# Patient Record
Sex: Male | Born: 1945 | Race: White | Hispanic: No | Marital: Married | Smoking: Never smoker
Health system: Southern US, Community
[De-identification: ages and names within clinical notes are randomized; demographics above are authoritative.]

## PROBLEM LIST (undated history)

## (undated) DIAGNOSIS — I10 Essential (primary) hypertension: Secondary | ICD-10-CM

## (undated) DIAGNOSIS — E7800 Pure hypercholesterolemia, unspecified: Secondary | ICD-10-CM

## (undated) HISTORY — PX: COLONOSCOPY: SHX174

---

## 2003-02-18 ENCOUNTER — Ambulatory Visit (HOSPITAL_COMMUNITY): Admission: RE | Admit: 2003-02-18 | Discharge: 2003-02-18 | Payer: Self-pay | Admitting: Family Medicine

## 2003-02-25 ENCOUNTER — Ambulatory Visit (HOSPITAL_COMMUNITY): Admission: RE | Admit: 2003-02-25 | Discharge: 2003-02-25 | Payer: Self-pay | Admitting: Family Medicine

## 2003-05-12 ENCOUNTER — Ambulatory Visit (HOSPITAL_COMMUNITY): Admission: RE | Admit: 2003-05-12 | Discharge: 2003-05-12 | Payer: Self-pay | Admitting: Internal Medicine

## 2004-09-14 ENCOUNTER — Ambulatory Visit (HOSPITAL_COMMUNITY): Admission: RE | Admit: 2004-09-14 | Discharge: 2004-09-14 | Payer: Self-pay | Admitting: Family Medicine

## 2008-08-25 ENCOUNTER — Emergency Department (HOSPITAL_COMMUNITY): Admission: EM | Admit: 2008-08-25 | Discharge: 2008-08-25 | Payer: Self-pay | Admitting: Emergency Medicine

## 2010-03-15 ENCOUNTER — Encounter (INDEPENDENT_AMBULATORY_CARE_PROVIDER_SITE_OTHER): Payer: Self-pay

## 2010-03-16 ENCOUNTER — Encounter: Payer: Self-pay | Admitting: Internal Medicine

## 2010-04-01 NOTE — Letter (Signed)
Summary: TRIAGE  TRIAGE   Imported By: Rexene Alberts 03/16/2010 08:18:33  _____________________________________________________________________  External Attachment:    Type:   Image     Comment:   External Document  Appended Document: TRIAGE ok as is  Appended Document: TRIAGE Rx and instructions mailed to pt. Order was faxed to [facility].

## 2010-04-01 NOTE — Miscellaneous (Signed)
Summary: Colonoscopy 05/12/2003  Clinical Lists Changes      NAME:  Ronald Dixon, Ronald Dixon                        ACCOUNT NO.:  [id number]   MEDICAL RECORD NO.:  [id number]                   PATIENT TYPE:  AMB   LOCATION:  DAY                                  FACILITY:  [facility]   PHYSICIAN:  R. Roetta Sessions, M.D.              DATE OF BIRTH:  September 04, 1945   DATE OF PROCEDURE:  05/12/2003  DATE OF DISCHARGE:                                 OPERATIVE REPORT   PROCEDURE:  High-risk screening colonoscopy.   INDICATIONS FOR PROCEDURE:  The patient is a 65 year old gentleman with a  history of colon cancer in his brother who was diagnosed at age 13.  This  gentleman had a colonoscopy in 1999 which was normal.  He is devoid of any  lower GI tract symptoms.  High-risk screening colonoscopy is now being  performed.  This approach has been discussed with the patient previously and  again today at the bedside.  The potential risks, benefits, and alternatives  have been reviewed.  Please see my handwritten H&P for more information.   PROCEDURE:  O2 saturation, blood pressure, pulses, and respirations were  monitored throughout the entirety of the procedure.  Conscious sedation was  with Versed 4 mg IV, Demerol 75 mg IV, Atropine 0.25 mg IV for asymptomatic  bradycardia (he was bradycardic during the procedure last time, and Atropine  was given prior to the procedure this time).  The instrument used was the  Olympus video chip adult colonoscope.   FINDINGS:  Digital rectal examination revealed no abnormalities.   ENDOSCOPIC FINDINGS:  The prep was excellent.   Rectum:  Examination of the rectal mucosa including retroflex view of the  anal verge revealed no abnormalities.   Colon:  The colonic mucosa was surveyed from the rectosigmoid junction  through the left, transverse, right colon to the area of the appendiceal  orifice, ileocecal valve/cecum.  These structures were well-seen and  photographed for the record.  From this level, the scope was slowly  withdrawn.  All previously mentioned mucosal surfaces were again seen.  The  colonic mucosa and the rectal mucosa appeared entirely normal.  The patient  tolerated the procedure well and was reactive in endoscopy.   IMPRESSION:  1. Normal rectum.  2. Normal colon.   RECOMMENDATIONS:  Repeat colonoscopy in five years.      ___________________________________________                                            Jonathon Bellows, M.D.   RMR/MEDQ  D:  05/12/2003  T:  05/12/2003  Job:  [id number]   cc:   Corrie Mckusick, M.D.  [address] [place]. [place]  [place]  [place] [address]  Fax: [phone]

## 2010-04-05 ENCOUNTER — Encounter: Payer: BC Managed Care – PPO | Admitting: Internal Medicine

## 2010-04-05 ENCOUNTER — Ambulatory Visit (HOSPITAL_COMMUNITY)
Admission: RE | Admit: 2010-04-05 | Discharge: 2010-04-05 | Disposition: A | Discharge status: Home / Self Care | Payer: BC Managed Care – PPO | Source: Ambulatory Visit | Attending: Internal Medicine | Admitting: Internal Medicine

## 2010-04-05 DIAGNOSIS — Z79899 Other long term (current) drug therapy: Secondary | ICD-10-CM | POA: Insufficient documentation

## 2010-04-05 DIAGNOSIS — Z1211 Encounter for screening for malignant neoplasm of colon: Secondary | ICD-10-CM | POA: Insufficient documentation

## 2010-04-05 DIAGNOSIS — I10 Essential (primary) hypertension: Secondary | ICD-10-CM | POA: Insufficient documentation

## 2010-04-05 DIAGNOSIS — E785 Hyperlipidemia, unspecified: Secondary | ICD-10-CM | POA: Insufficient documentation

## 2010-04-05 DIAGNOSIS — Z800 Family history of malignant neoplasm of digestive organs: Secondary | ICD-10-CM

## 2010-04-20 NOTE — Op Note (Signed)
  NAME:  Ronald Dixon, Ronald Dixon              ACCOUNT NO.:  [id number]  MEDICAL RECORD NO.:  [id number]           PATIENT TYPE:  O  LOCATION:  DAYP                          FACILITY:  [facility]  PHYSICIAN:  R. Roetta Sessions, M.D. DATE OF BIRTH:  [birth year]  DATE OF PROCEDURE:  04/05/2010 DATE OF DISCHARGE:                              OPERATIVE REPORT   PROCEDURE:  High risk screening colonoscopy.  INDICATIONS FOR PROCEDURE:  A 65 year old gentleman with positive family history of colon cancer first-degree relative at young age.  He is here for followup screening colonoscopy, had a normal exam back in 2005.  He has no lower GI tract symptoms.  Colonoscopy is now being done as high risk screening maneuver.  Risks, benefits, limitations, alternatives, imponderables have been reviewed, questions answered.  Please see the documentation in the medical record.  PROCEDURE NOTE:  O2 saturation, blood pressure, pulse, respirations were monitored throughout the entire procedure.  CONSCIOUS SEDATION:  Versed 5 mg IV, Demerol 125 mg IV in divided doses.  INSTRUMENT:  Pentax video chip system.  FINDINGS:  Digital rectal exam revealed no abnormalities.  Endoscopic findings:  Prep was good.  Examination of colonic mucosa was undertaken from the rectosigmoid junction through the left transverse right colon to the appendiceal orifice, ileocecal valve/cecum.  These structures were well seen and photographed for the record.  Terminal ileum was intubated to 10 cm.  From this level, scope was slowly withdrawn.  All previously mentioned mucosal surfaces were again seen.  The colonic mucosa appeared entirely normal.  The scope was pulled down into the rectum where a thorough examination of rectal mucosa including retroflexed view of the anal verge demonstrated no abnormalities.  The patient tolerated the procedure well.  Cecal withdrawal time 10 minutes.  IMPRESSION:  Normal rectum, colon, and terminal  ileum.  RECOMMENDATIONS:  Repeat screening colonoscopy in 5 years.     Jonathon Bellows, M.D.     RMR/MEDQ  D:  04/05/2010  T:  04/05/2010  Job:  [id number]  cc:   Corrie Mckusick, M.D. Fax: [phone]  Electronically Signed by Lorrin Goodell M.D. on 04/20/2010 02:32:41 PM

## 2010-06-07 LAB — DIFFERENTIAL
Basophils Absolute: 0 10*3/uL (ref 0.0–0.1)
Basophils Relative: 0 % (ref 0–1)
Eosinophils Absolute: 0 10*3/uL (ref 0.0–0.7)
Eosinophils Relative: 1 % (ref 0–5)
Lymphocytes Relative: 38 % (ref 12–46)
Lymphs Abs: 2.4 10*3/uL (ref 0.7–4.0)
Monocytes Absolute: 0.5 10*3/uL (ref 0.1–1.0)
Monocytes Relative: 8 % (ref 3–12)
Neutro Abs: 3.3 10*3/uL (ref 1.7–7.7)
Neutrophils Relative %: 53 % (ref 43–77)

## 2010-06-07 LAB — CBC
HCT: 43 % (ref 39.0–52.0)
Hemoglobin: 15.4 g/dL (ref 13.0–17.0)
MCHC: 35.9 g/dL (ref 30.0–36.0)
MCV: 90.6 fL (ref 78.0–100.0)
Platelets: 228 10*3/uL (ref 150–400)
RBC: 4.75 MIL/uL (ref 4.22–5.81)
RDW: 12.6 % (ref 11.5–15.5)
WBC: 6.3 10*3/uL (ref 4.0–10.5)

## 2010-06-07 LAB — BASIC METABOLIC PANEL
BUN: 22 mg/dL (ref 6–23)
CO2: 24 mEq/L (ref 19–32)
Calcium: 8.9 mg/dL (ref 8.4–10.5)
Chloride: 104 mEq/L (ref 96–112)
Creatinine, Ser: 0.97 mg/dL (ref 0.4–1.5)
GFR calc Af Amer: >60 mL/min (ref >60)
GFR calc non Af Amer: >60 mL/min (ref >60)
Glucose, Bld: 140 mg/dL — ABNORMAL HIGH (ref 70–99)
Potassium: 3.7 mEq/L (ref 3.5–5.1)
Sodium: 136 mEq/L (ref 135–145)

## 2010-07-16 NOTE — Op Note (Signed)
NAME:  Resch, Olson C                        ACCOUNT NO.:  [id number]   MEDICAL RECORD NO.:  [id number]                   PATIENT TYPE:  AMB   LOCATION:  DAY                                  FACILITY:  [facility]   PHYSICIAN:  R. Roetta Sessions, M.D.              DATE OF BIRTH:  Apr 03, 1945   DATE OF PROCEDURE:  05/12/2003  DATE OF DISCHARGE:                                 OPERATIVE REPORT   PROCEDURE:  High-risk screening colonoscopy.   INDICATIONS FOR PROCEDURE:  The patient is a 65 year old gentleman with a  history of colon cancer in his brother who was diagnosed at age 72.  This  gentleman had a colonoscopy in 1999 which was normal.  He is devoid of any  lower GI tract symptoms.  High-risk screening colonoscopy is now being  performed.  This approach has been discussed with the patient previously and  again today at the bedside.  The potential risks, benefits, and alternatives  have been reviewed.  Please see my handwritten H&P for more information.   PROCEDURE:  O2 saturation, blood pressure, pulses, and respirations were  monitored throughout the entirety of the procedure.  Conscious sedation was  with Versed 4 mg IV, Demerol 75 mg IV, Atropine 0.25 mg IV for asymptomatic  bradycardia (he was bradycardic during the procedure last time, and Atropine  was given prior to the procedure this time).  The instrument used was the  Olympus video chip adult colonoscope.   FINDINGS:  Digital rectal examination revealed no abnormalities.   ENDOSCOPIC FINDINGS:  The prep was excellent.   Rectum:  Examination of the rectal mucosa including retroflex view of the  anal verge revealed no abnormalities.   Colon:  The colonic mucosa was surveyed from the rectosigmoid junction  through the left, transverse, right colon to the area of the appendiceal  orifice, ileocecal valve/cecum.  These structures were well-seen and  photographed for the record.  From this level, the scope was slowly  withdrawn.  All previously mentioned mucosal surfaces were again seen.  The  colonic mucosa and the rectal mucosa appeared entirely normal.  The patient  tolerated the procedure well and was reactive in endoscopy.   IMPRESSION:  1. Normal rectum.  2. Normal colon.   RECOMMENDATIONS:  Repeat colonoscopy in five years.      ___________________________________________                                            Jonathon Bellows, M.D.   RMR/MEDQ  D:  05/12/2003  T:  05/12/2003  Job:  [id number]   cc:   Corrie Mckusick, M.D.  [address] [place]. [place]  [place]  [place] [address]  Fax: [phone]

## 2012-05-09 DIAGNOSIS — Z85828 Personal history of other malignant neoplasm of skin: Secondary | ICD-10-CM | POA: Diagnosis not present

## 2012-05-09 DIAGNOSIS — L821 Other seborrheic keratosis: Secondary | ICD-10-CM | POA: Diagnosis not present

## 2012-05-09 DIAGNOSIS — L570 Actinic keratosis: Secondary | ICD-10-CM | POA: Diagnosis not present

## 2012-05-09 DIAGNOSIS — D2320 Other benign neoplasm of skin of unspecified ear and external auricular canal: Secondary | ICD-10-CM | POA: Diagnosis not present

## 2012-09-03 DIAGNOSIS — D0430 Carcinoma in situ of skin of unspecified part of face: Secondary | ICD-10-CM | POA: Diagnosis not present

## 2012-09-03 DIAGNOSIS — Z85828 Personal history of other malignant neoplasm of skin: Secondary | ICD-10-CM | POA: Diagnosis not present

## 2012-09-03 DIAGNOSIS — D0439 Carcinoma in situ of skin of other parts of face: Secondary | ICD-10-CM | POA: Diagnosis not present

## 2012-09-25 DIAGNOSIS — Z85828 Personal history of other malignant neoplasm of skin: Secondary | ICD-10-CM | POA: Diagnosis not present

## 2012-09-25 DIAGNOSIS — C44320 Squamous cell carcinoma of skin of unspecified parts of face: Secondary | ICD-10-CM | POA: Diagnosis not present

## 2012-11-19 DIAGNOSIS — L819 Disorder of pigmentation, unspecified: Secondary | ICD-10-CM | POA: Diagnosis not present

## 2012-11-19 DIAGNOSIS — Z85828 Personal history of other malignant neoplasm of skin: Secondary | ICD-10-CM | POA: Diagnosis not present

## 2012-11-19 DIAGNOSIS — L570 Actinic keratosis: Secondary | ICD-10-CM | POA: Diagnosis not present

## 2013-01-30 DIAGNOSIS — J069 Acute upper respiratory infection, unspecified: Secondary | ICD-10-CM | POA: Diagnosis not present

## 2013-01-30 DIAGNOSIS — H6980 Other specified disorders of Eustachian tube, unspecified ear: Secondary | ICD-10-CM | POA: Diagnosis not present

## 2013-01-30 DIAGNOSIS — Z6830 Body mass index (BMI) 30.0-30.9, adult: Secondary | ICD-10-CM | POA: Diagnosis not present

## 2013-03-21 DIAGNOSIS — Z0000 Encounter for general adult medical examination without abnormal findings: Secondary | ICD-10-CM | POA: Diagnosis not present

## 2013-03-21 DIAGNOSIS — E785 Hyperlipidemia, unspecified: Secondary | ICD-10-CM | POA: Diagnosis not present

## 2013-03-21 DIAGNOSIS — Z6830 Body mass index (BMI) 30.0-30.9, adult: Secondary | ICD-10-CM | POA: Diagnosis not present

## 2013-03-21 DIAGNOSIS — I10 Essential (primary) hypertension: Secondary | ICD-10-CM | POA: Diagnosis not present

## 2013-03-21 DIAGNOSIS — R972 Elevated prostate specific antigen [PSA]: Secondary | ICD-10-CM | POA: Diagnosis not present

## 2013-03-25 DIAGNOSIS — Z85828 Personal history of other malignant neoplasm of skin: Secondary | ICD-10-CM | POA: Diagnosis not present

## 2013-03-25 DIAGNOSIS — L570 Actinic keratosis: Secondary | ICD-10-CM | POA: Diagnosis not present

## 2013-03-25 DIAGNOSIS — L821 Other seborrheic keratosis: Secondary | ICD-10-CM | POA: Diagnosis not present

## 2013-04-11 DIAGNOSIS — Z6831 Body mass index (BMI) 31.0-31.9, adult: Secondary | ICD-10-CM | POA: Diagnosis not present

## 2013-04-11 DIAGNOSIS — R972 Elevated prostate specific antigen [PSA]: Secondary | ICD-10-CM | POA: Diagnosis not present

## 2013-07-18 DIAGNOSIS — H524 Presbyopia: Secondary | ICD-10-CM | POA: Diagnosis not present

## 2013-07-18 DIAGNOSIS — H52229 Regular astigmatism, unspecified eye: Secondary | ICD-10-CM | POA: Diagnosis not present

## 2013-07-18 DIAGNOSIS — H43819 Vitreous degeneration, unspecified eye: Secondary | ICD-10-CM | POA: Diagnosis not present

## 2013-07-18 DIAGNOSIS — H5200 Hypermetropia, unspecified eye: Secondary | ICD-10-CM | POA: Diagnosis not present

## 2013-09-23 DIAGNOSIS — L821 Other seborrheic keratosis: Secondary | ICD-10-CM | POA: Diagnosis not present

## 2013-09-23 DIAGNOSIS — L570 Actinic keratosis: Secondary | ICD-10-CM | POA: Diagnosis not present

## 2013-09-23 DIAGNOSIS — Z85828 Personal history of other malignant neoplasm of skin: Secondary | ICD-10-CM | POA: Diagnosis not present

## 2014-03-19 DIAGNOSIS — L570 Actinic keratosis: Secondary | ICD-10-CM | POA: Diagnosis not present

## 2014-03-19 DIAGNOSIS — D485 Neoplasm of uncertain behavior of skin: Secondary | ICD-10-CM | POA: Diagnosis not present

## 2014-03-19 DIAGNOSIS — Z85828 Personal history of other malignant neoplasm of skin: Secondary | ICD-10-CM | POA: Diagnosis not present

## 2014-03-19 DIAGNOSIS — L821 Other seborrheic keratosis: Secondary | ICD-10-CM | POA: Diagnosis not present

## 2014-04-01 DIAGNOSIS — Z23 Encounter for immunization: Secondary | ICD-10-CM | POA: Diagnosis not present

## 2014-04-01 DIAGNOSIS — E6609 Other obesity due to excess calories: Secondary | ICD-10-CM | POA: Diagnosis not present

## 2014-04-01 DIAGNOSIS — R7301 Impaired fasting glucose: Secondary | ICD-10-CM | POA: Diagnosis not present

## 2014-04-01 DIAGNOSIS — Z6830 Body mass index (BMI) 30.0-30.9, adult: Secondary | ICD-10-CM | POA: Diagnosis not present

## 2014-04-01 DIAGNOSIS — Z0000 Encounter for general adult medical examination without abnormal findings: Secondary | ICD-10-CM | POA: Diagnosis not present

## 2014-04-01 DIAGNOSIS — I10 Essential (primary) hypertension: Secondary | ICD-10-CM | POA: Diagnosis not present

## 2014-04-01 DIAGNOSIS — E782 Mixed hyperlipidemia: Secondary | ICD-10-CM | POA: Diagnosis not present

## 2014-04-15 DIAGNOSIS — Z125 Encounter for screening for malignant neoplasm of prostate: Secondary | ICD-10-CM | POA: Diagnosis not present

## 2014-04-15 DIAGNOSIS — R7301 Impaired fasting glucose: Secondary | ICD-10-CM | POA: Diagnosis not present

## 2014-04-15 DIAGNOSIS — Z23 Encounter for immunization: Secondary | ICD-10-CM | POA: Diagnosis not present

## 2014-04-15 DIAGNOSIS — I10 Essential (primary) hypertension: Secondary | ICD-10-CM | POA: Diagnosis not present

## 2014-04-15 DIAGNOSIS — E782 Mixed hyperlipidemia: Secondary | ICD-10-CM | POA: Diagnosis not present

## 2014-06-16 DIAGNOSIS — L814 Other melanin hyperpigmentation: Secondary | ICD-10-CM | POA: Diagnosis not present

## 2014-06-16 DIAGNOSIS — Z85828 Personal history of other malignant neoplasm of skin: Secondary | ICD-10-CM | POA: Diagnosis not present

## 2014-06-16 DIAGNOSIS — L570 Actinic keratosis: Secondary | ICD-10-CM | POA: Diagnosis not present

## 2014-11-17 DIAGNOSIS — L820 Inflamed seborrheic keratosis: Secondary | ICD-10-CM | POA: Diagnosis not present

## 2014-11-17 DIAGNOSIS — L570 Actinic keratosis: Secondary | ICD-10-CM | POA: Diagnosis not present

## 2014-11-17 DIAGNOSIS — Z85828 Personal history of other malignant neoplasm of skin: Secondary | ICD-10-CM | POA: Diagnosis not present

## 2014-11-17 DIAGNOSIS — D225 Melanocytic nevi of trunk: Secondary | ICD-10-CM | POA: Diagnosis not present

## 2014-11-17 DIAGNOSIS — L738 Other specified follicular disorders: Secondary | ICD-10-CM | POA: Diagnosis not present

## 2015-03-24 ENCOUNTER — Encounter: Payer: Self-pay | Admitting: Internal Medicine

## 2015-04-24 DIAGNOSIS — Z125 Encounter for screening for malignant neoplasm of prostate: Secondary | ICD-10-CM | POA: Diagnosis not present

## 2015-04-24 DIAGNOSIS — Z1389 Encounter for screening for other disorder: Secondary | ICD-10-CM | POA: Diagnosis not present

## 2015-04-24 DIAGNOSIS — I10 Essential (primary) hypertension: Secondary | ICD-10-CM | POA: Diagnosis not present

## 2015-04-24 DIAGNOSIS — E6609 Other obesity due to excess calories: Secondary | ICD-10-CM | POA: Diagnosis not present

## 2015-04-24 DIAGNOSIS — R7309 Other abnormal glucose: Secondary | ICD-10-CM | POA: Diagnosis not present

## 2015-04-24 DIAGNOSIS — Z6830 Body mass index (BMI) 30.0-30.9, adult: Secondary | ICD-10-CM | POA: Diagnosis not present

## 2015-04-24 DIAGNOSIS — E782 Mixed hyperlipidemia: Secondary | ICD-10-CM | POA: Diagnosis not present

## 2015-04-24 DIAGNOSIS — Z0000 Encounter for general adult medical examination without abnormal findings: Secondary | ICD-10-CM | POA: Diagnosis not present

## 2015-07-06 DIAGNOSIS — L570 Actinic keratosis: Secondary | ICD-10-CM | POA: Diagnosis not present

## 2015-07-06 DIAGNOSIS — L814 Other melanin hyperpigmentation: Secondary | ICD-10-CM | POA: Diagnosis not present

## 2015-07-06 DIAGNOSIS — Z85828 Personal history of other malignant neoplasm of skin: Secondary | ICD-10-CM | POA: Diagnosis not present

## 2015-12-07 DIAGNOSIS — C4441 Basal cell carcinoma of skin of scalp and neck: Secondary | ICD-10-CM | POA: Diagnosis not present

## 2015-12-07 DIAGNOSIS — D1801 Hemangioma of skin and subcutaneous tissue: Secondary | ICD-10-CM | POA: Diagnosis not present

## 2015-12-07 DIAGNOSIS — C44319 Basal cell carcinoma of skin of other parts of face: Secondary | ICD-10-CM | POA: Diagnosis not present

## 2015-12-07 DIAGNOSIS — Z85828 Personal history of other malignant neoplasm of skin: Secondary | ICD-10-CM | POA: Diagnosis not present

## 2015-12-07 DIAGNOSIS — D2271 Melanocytic nevi of right lower limb, including hip: Secondary | ICD-10-CM | POA: Diagnosis not present

## 2015-12-07 DIAGNOSIS — L821 Other seborrheic keratosis: Secondary | ICD-10-CM | POA: Diagnosis not present

## 2015-12-07 DIAGNOSIS — L570 Actinic keratosis: Secondary | ICD-10-CM | POA: Diagnosis not present

## 2015-12-07 DIAGNOSIS — D225 Melanocytic nevi of trunk: Secondary | ICD-10-CM | POA: Diagnosis not present

## 2015-12-21 DIAGNOSIS — C44319 Basal cell carcinoma of skin of other parts of face: Secondary | ICD-10-CM | POA: Diagnosis not present

## 2015-12-21 DIAGNOSIS — Z85828 Personal history of other malignant neoplasm of skin: Secondary | ICD-10-CM | POA: Diagnosis not present

## 2015-12-28 DIAGNOSIS — Z4802 Encounter for removal of sutures: Secondary | ICD-10-CM | POA: Diagnosis not present

## 2016-01-06 DIAGNOSIS — Z23 Encounter for immunization: Secondary | ICD-10-CM | POA: Diagnosis not present

## 2016-02-02 DIAGNOSIS — M25561 Pain in right knee: Secondary | ICD-10-CM | POA: Diagnosis not present

## 2016-04-25 DIAGNOSIS — Z125 Encounter for screening for malignant neoplasm of prostate: Secondary | ICD-10-CM | POA: Diagnosis not present

## 2016-04-25 DIAGNOSIS — Z6831 Body mass index (BMI) 31.0-31.9, adult: Secondary | ICD-10-CM | POA: Diagnosis not present

## 2016-04-25 DIAGNOSIS — Z1389 Encounter for screening for other disorder: Secondary | ICD-10-CM | POA: Diagnosis not present

## 2016-04-25 DIAGNOSIS — I10 Essential (primary) hypertension: Secondary | ICD-10-CM | POA: Diagnosis not present

## 2016-04-25 DIAGNOSIS — E782 Mixed hyperlipidemia: Secondary | ICD-10-CM | POA: Diagnosis not present

## 2016-04-25 DIAGNOSIS — Z0001 Encounter for general adult medical examination with abnormal findings: Secondary | ICD-10-CM | POA: Diagnosis not present

## 2016-04-25 DIAGNOSIS — E6609 Other obesity due to excess calories: Secondary | ICD-10-CM | POA: Diagnosis not present

## 2016-04-25 DIAGNOSIS — R7309 Other abnormal glucose: Secondary | ICD-10-CM | POA: Diagnosis not present

## 2016-06-13 DIAGNOSIS — L570 Actinic keratosis: Secondary | ICD-10-CM | POA: Diagnosis not present

## 2016-06-13 DIAGNOSIS — L821 Other seborrheic keratosis: Secondary | ICD-10-CM | POA: Diagnosis not present

## 2016-06-13 DIAGNOSIS — Z85828 Personal history of other malignant neoplasm of skin: Secondary | ICD-10-CM | POA: Diagnosis not present

## 2016-12-13 DIAGNOSIS — L570 Actinic keratosis: Secondary | ICD-10-CM | POA: Diagnosis not present

## 2016-12-13 DIAGNOSIS — L821 Other seborrheic keratosis: Secondary | ICD-10-CM | POA: Diagnosis not present

## 2016-12-13 DIAGNOSIS — Z85828 Personal history of other malignant neoplasm of skin: Secondary | ICD-10-CM | POA: Diagnosis not present

## 2016-12-13 DIAGNOSIS — D485 Neoplasm of uncertain behavior of skin: Secondary | ICD-10-CM | POA: Diagnosis not present

## 2016-12-13 DIAGNOSIS — D225 Melanocytic nevi of trunk: Secondary | ICD-10-CM | POA: Diagnosis not present

## 2016-12-13 DIAGNOSIS — D1801 Hemangioma of skin and subcutaneous tissue: Secondary | ICD-10-CM | POA: Diagnosis not present

## 2016-12-20 DIAGNOSIS — D485 Neoplasm of uncertain behavior of skin: Secondary | ICD-10-CM | POA: Diagnosis not present

## 2016-12-20 DIAGNOSIS — Z85828 Personal history of other malignant neoplasm of skin: Secondary | ICD-10-CM | POA: Diagnosis not present

## 2016-12-20 DIAGNOSIS — L988 Other specified disorders of the skin and subcutaneous tissue: Secondary | ICD-10-CM | POA: Diagnosis not present

## 2016-12-26 DIAGNOSIS — Z23 Encounter for immunization: Secondary | ICD-10-CM | POA: Diagnosis not present

## 2017-04-27 DIAGNOSIS — E782 Mixed hyperlipidemia: Secondary | ICD-10-CM | POA: Diagnosis not present

## 2017-04-27 DIAGNOSIS — Z6830 Body mass index (BMI) 30.0-30.9, adult: Secondary | ICD-10-CM | POA: Diagnosis not present

## 2017-04-27 DIAGNOSIS — I10 Essential (primary) hypertension: Secondary | ICD-10-CM | POA: Diagnosis not present

## 2017-04-27 DIAGNOSIS — Z0001 Encounter for general adult medical examination with abnormal findings: Secondary | ICD-10-CM | POA: Diagnosis not present

## 2017-04-27 DIAGNOSIS — Z8371 Family history of colonic polyps: Secondary | ICD-10-CM | POA: Diagnosis not present

## 2017-04-27 DIAGNOSIS — R7309 Other abnormal glucose: Secondary | ICD-10-CM | POA: Diagnosis not present

## 2017-04-27 DIAGNOSIS — E559 Vitamin D deficiency, unspecified: Secondary | ICD-10-CM | POA: Diagnosis not present

## 2017-04-27 DIAGNOSIS — Z125 Encounter for screening for malignant neoplasm of prostate: Secondary | ICD-10-CM | POA: Diagnosis not present

## 2017-04-27 DIAGNOSIS — Z1389 Encounter for screening for other disorder: Secondary | ICD-10-CM | POA: Diagnosis not present

## 2017-05-09 ENCOUNTER — Encounter: Payer: Self-pay | Admitting: Internal Medicine

## 2017-05-24 ENCOUNTER — Ambulatory Visit (INDEPENDENT_AMBULATORY_CARE_PROVIDER_SITE_OTHER): Payer: Medicare Other

## 2017-05-24 DIAGNOSIS — Z1211 Encounter for screening for malignant neoplasm of colon: Secondary | ICD-10-CM

## 2017-05-24 MED ORDER — PEG 3350-KCL-NA BICARB-NACL 420 G PO SOLR: 4000.0000 mL | ORAL | 0 refills | Status: DC

## 2017-05-24 NOTE — Progress Notes (Signed)
Gastroenterology Pre-Procedure Review  Request Date:05/24/17 Requesting Physician: Dr.Golding (last tcs 05/12/2003 RMR- no polyps.)  PATIENT REVIEW QUESTIONS: The patient responded to the following health history questions as indicated:    1. Diabetes Melitis: no 2. Joint replacements in the past 12 months: no 3. Major health problems in the past 3 months: no 4. Has an artificial valve or MVP: no 5. Has a defibrillator: no 6. Has been advised in past to take antibiotics in advance of a procedure like teeth cleaning: no 7. Family history of colon cancer: yes (brother age 71)  64. Alcohol Use: no 9. History of sleep apnea: no  10. History of coronary artery or other vascular stents placed within the last 12 months: no 11. History of any prior anesthesia complications: no    MEDICATIONS & ALLERGIES:    Patient reports the following regarding taking any blood thinners:   Plavix? no Aspirin? yes (81mg ) Coumadin? no Brilinta? no Xarelto? no Eliquis? no Pradaxa? no Savaysa? no Effient? no  Patient confirms/reports the following medications:  Current Outpatient Medications  Medication Sig Dispense Refill  . aspirin EC 81 MG tablet Take 81 mg by mouth daily.    . Cholecalciferol (VITAMIN D3) 10000 units TABS Take by mouth.    . Gluc-Chonn-MSM-Boswellia-Vit D (GLUCOSAMINE CHOND TRIPLE/VIT D) TABS Take by mouth.    . losartan-hydrochlorothiazide (HYZAAR) 100-25 MG tablet     . naproxen sodium (ALEVE) 220 MG tablet Take 220 mg by mouth.    . simvastatin (ZOCOR) 40 MG tablet   0   No current facility-administered medications for this visit.     Patient confirms/reports the following allergies:  No Known Allergies  No orders of the defined types were placed in this encounter.   AUTHORIZATION INFORMATION Primary Insurance: medicare,  ID #: [id number] Pre-Cert / Auth required: no   SCHEDULE INFORMATION: Procedure has been scheduled as follows:  Date: 08/16/17, Time:  10:30 Location: [facility] Dr.Rourk  This Gastroenterology Pre-Precedure Review Form is being routed to the following provider(s): Walden Field NP

## 2017-05-24 NOTE — Progress Notes (Signed)
Ok to schedule.

## 2017-05-24 NOTE — Addendum Note (Signed)
Addended by: Claudina Lick on: 05/24/2017 01:47 PM   Modules accepted: Orders

## 2017-05-24 NOTE — Patient Instructions (Signed)
Ronald Dixon   Jul 25, 1945 MRN: [id number]    Procedure Date: 08/16/17 Time to register: 9:30 Place to register: [facility] Procedure Time: 10:30 Scheduled provider: R. Garfield Cornea, MD  PREPARATION FOR COLONOSCOPY WITH TRI-LYTE SPLIT PREP  Please notify us immediately if you are diabetic, take iron supplements, or if you are on Coumadin or any other blood thinners.     You will need to purchase 1 fleet enema and 1 box of Bisacodyl '5mg'$  tablets.   2 DAYS BEFORE PROCEDURE:  DATE: 08/14/17   DAY: Monday Begin clear liquid diet AFTER your lunch meal. NO SOLID FOODS after this point.  1 DAY BEFORE PROCEDURE:  DATE: 08/15/17   DAY: Tuesday Continue clear liquids the entire day - NO SOLID FOOD.     At 2:00 pm:  Take 2 Bisacodyl tablets.   At 4:00pm:  Start drinking your solution. Make sure you mix well per instructions on the bottle. Try to drink 1 (one) 8 ounce glass every 10-15 minutes until you have consumed HALF the jug. You should complete by 6:00pm.You must keep the left over solution refrigerated until completed next day.  Continue clear liquids. You must drink plenty of clear liquids to prevent dehyration and kidney failure. Nothing to eat or drink after midnight.  EXCEPTION: If you take medications for your heart, blood pressure or breathing, you may take these medications with a small amount of clear liquid.    DAY OF PROCEDURE:   DATE: 08/16/17   DAY: Wednesday    Five hours before your procedure time @ 5:30am:  Finish remaining amout of bowel prep, drinking 1 (one) 8 ounce glass every 10-15 minutes until complete. You have two hours to consume remaining prep.   Three hours before your procedure time '@7'$ :30am:  Nothing by mouth.   At least one hour before going to the hospital:  Give yourself one Fleet enema. You may take your morning medications with sip of water unless we have instructed otherwise.      Please see below for Dietary  Information.  CLEAR LIQUIDS INCLUDE:  Water Jello (NOT red in color)   Ice Popsicles (NOT red in color)   Tea (sugar ok, no milk/cream) Powdered fruit flavored drinks  Coffee (sugar ok, no milk/cream) Gatorade/ Lemonade/ Kool-Aid  (NOT red in color)   Juice: apple, white grape, white cranberry Soft drinks  Clear bullion, consomme, broth (fat free beef/chicken/vegetable)  Carbonated beverages (any kind)  Strained chicken noodle soup Hard Candy   Remember: Clear liquids are liquids that will allow you to see your fingers on the other side of a clear glass. Be sure liquids are NOT red in color, and not cloudy, but CLEAR.  DO NOT EAT OR DRINK ANY OF THE FOLLOWING:  Dairy products of any kind   Cranberry juice Tomato juice / V8 juice   Grapefruit juice Orange juice     Red grape juice  Do not eat any solid foods, including such foods as: cereal, oatmeal, yogurt, fruits, vegetables, creamed soups, eggs, bread, crackers, pureed foods in a blender, etc.   HELPFUL HINTS FOR DRINKING PREP SOLUTION:   Make sure prep is extremely cold. Mix and refrigerate the the morning of the prep. You may also put in the freezer.   You may try mixing some Crystal Light or Country Time Lemonade if you prefer. Mix in small amounts; add more if necessary.  Try drinking through a straw  Rinse mouth with water or a mouthwash  between glasses, to remove after-taste.  Try sipping on a cold beverage /ice/ popsicles between glasses of prep.  Place a piece of sugar-free hard candy in mouth between glasses.  If you become nauseated, try consuming smaller amounts, or stretch out the time between glasses. Stop for 30-60 minutes, then slowly start back drinking.        OTHER INSTRUCTIONS  You will need a responsible adult at least 71 years of age to accompany you and drive you home. This person must remain in the waiting room during your procedure. The hospital will cancel your procedure if you do not have a  responsible adult with you.   1. Wear loose fitting clothing that is easily removed. 2. Leave jewelry and other valuables at home.  3. Remove all body piercing jewelry and leave at home. 4. Total time from sign-in until discharge is approximately 2-3 hours. 5. You should go home directly after your procedure and rest. You can resume normal activities the day after your procedure. 6. The day of your procedure you should not:  Drive  Make legal decisions  Operate machinery  Drink alcohol  Return to work   You may call the office (Dept: [phone]) before 5:00pm, or page the doctor on call [phone]) after 5:00pm, for further instructions, if necessary.   Insurance Information YOU WILL NEED TO CHECK WITH YOUR INSURANCE COMPANY FOR THE BENEFITS OF COVERAGE YOU HAVE FOR THIS PROCEDURE.  UNFORTUNATELY, NOT ALL INSURANCE COMPANIES HAVE BENEFITS TO COVER ALL OR PART OF THESE TYPES OF PROCEDURES.  IT IS YOUR RESPONSIBILITY TO CHECK YOUR BENEFITS, HOWEVER, WE WILL BE GLAD TO ASSIST YOU WITH ANY CODES YOUR INSURANCE COMPANY MAY NEED.    PLEASE NOTE THAT MOST INSURANCE COMPANIES WILL NOT COVER A SCREENING COLONOSCOPY FOR PEOPLE UNDER THE AGE OF 50  IF YOU HAVE BCBS INSURANCE, YOU MAY HAVE BENEFITS FOR A SCREENING COLONOSCOPY BUT IF POLYPS ARE FOUND THE DIAGNOSIS WILL CHANGE AND THEN YOU MAY HAVE A DEDUCTIBLE THAT WILL NEED TO BE MET. SO PLEASE MAKE SURE YOU CHECK YOUR BENEFITS FOR A SCREENING COLONOSCOPY AS WELL AS A DIAGNOSTIC COLONOSCOPY.

## 2017-06-13 DIAGNOSIS — Z85828 Personal history of other malignant neoplasm of skin: Secondary | ICD-10-CM | POA: Diagnosis not present

## 2017-06-13 DIAGNOSIS — L570 Actinic keratosis: Secondary | ICD-10-CM | POA: Diagnosis not present

## 2017-06-13 DIAGNOSIS — D225 Melanocytic nevi of trunk: Secondary | ICD-10-CM | POA: Diagnosis not present

## 2017-06-13 DIAGNOSIS — L821 Other seborrheic keratosis: Secondary | ICD-10-CM | POA: Diagnosis not present

## 2017-08-10 ENCOUNTER — Telehealth: Payer: Self-pay | Admitting: Internal Medicine

## 2017-08-10 NOTE — Telephone Encounter (Signed)
I called [facility] and left message with rx and asked them to get it ready for the pt.

## 2017-08-10 NOTE — Telephone Encounter (Signed)
Patient asked if you could send his prep prescription into his pharmacy again, they said they do not have it and I explained to him that they received it in march and probably put it back out.

## 2017-08-16 ENCOUNTER — Other Ambulatory Visit: Payer: Self-pay

## 2017-08-16 ENCOUNTER — Encounter (HOSPITAL_COMMUNITY): Payer: Self-pay | Admitting: *Deleted

## 2017-08-16 ENCOUNTER — Encounter (HOSPITAL_COMMUNITY)
Admission: RE | Disposition: A | Discharge status: Home / Self Care | Payer: Self-pay | Source: Ambulatory Visit | Attending: Internal Medicine

## 2017-08-16 ENCOUNTER — Ambulatory Visit (HOSPITAL_COMMUNITY)
Admission: RE | Admit: 2017-08-16 | Discharge: 2017-08-16 | Disposition: A | Discharge status: Home / Self Care | Payer: Medicare Other | Source: Ambulatory Visit | Attending: Internal Medicine | Admitting: Internal Medicine

## 2017-08-16 DIAGNOSIS — Z7982 Long term (current) use of aspirin: Secondary | ICD-10-CM | POA: Insufficient documentation

## 2017-08-16 DIAGNOSIS — Z800 Family history of malignant neoplasm of digestive organs: Secondary | ICD-10-CM | POA: Insufficient documentation

## 2017-08-16 DIAGNOSIS — Z1211 Encounter for screening for malignant neoplasm of colon: Secondary | ICD-10-CM | POA: Diagnosis not present

## 2017-08-16 DIAGNOSIS — E7800 Pure hypercholesterolemia, unspecified: Secondary | ICD-10-CM | POA: Insufficient documentation

## 2017-08-16 DIAGNOSIS — Z79899 Other long term (current) drug therapy: Secondary | ICD-10-CM | POA: Diagnosis not present

## 2017-08-16 DIAGNOSIS — I10 Essential (primary) hypertension: Secondary | ICD-10-CM | POA: Diagnosis not present

## 2017-08-16 HISTORY — PX: COLONOSCOPY: SHX5424

## 2017-08-16 HISTORY — DX: Pure hypercholesterolemia, unspecified: E78.00

## 2017-08-16 HISTORY — DX: Essential (primary) hypertension: I10

## 2017-08-16 SURGERY — COLONOSCOPY
Anesthesia: Moderate Sedation

## 2017-08-16 MED ORDER — ONDANSETRON HCL 4 MG/2ML IJ SOLN
INTRAMUSCULAR | Status: AC
  Filled 2017-08-16: qty 2

## 2017-08-16 MED ORDER — MEPERIDINE HCL 100 MG/ML IJ SOLN
INTRAMUSCULAR | Status: DC | PRN
  Administered 2017-08-16: 50 mg via INTRAVENOUS
  Administered 2017-08-16: 25 mg via INTRAVENOUS

## 2017-08-16 MED ORDER — MIDAZOLAM HCL 5 MG/5ML IJ SOLN
INTRAMUSCULAR | Status: AC
  Filled 2017-08-16: qty 10

## 2017-08-16 MED ORDER — STERILE WATER FOR IRRIGATION IR SOLN
Status: DC | PRN
  Administered 2017-08-16: 100 mL

## 2017-08-16 MED ORDER — MEPERIDINE HCL 100 MG/ML IJ SOLN
INTRAMUSCULAR | Status: AC
  Filled 2017-08-16: qty 2

## 2017-08-16 MED ORDER — MIDAZOLAM HCL 5 MG/5ML IJ SOLN
INTRAMUSCULAR | Status: DC | PRN
  Administered 2017-08-16: 1 mg via INTRAVENOUS
  Administered 2017-08-16: 2 mg via INTRAVENOUS
  Administered 2017-08-16: 0.5 mg via INTRAVENOUS

## 2017-08-16 MED ORDER — ONDANSETRON HCL 4 MG/2ML IJ SOLN
INTRAMUSCULAR | Status: DC | PRN
  Administered 2017-08-16: 4 mg via INTRAVENOUS

## 2017-08-16 MED ORDER — SODIUM CHLORIDE 0.9 % IV SOLN
INTRAVENOUS | Status: DC
  Administered 2017-08-16: 09:00:00 via INTRAVENOUS

## 2017-08-16 NOTE — Discharge Instructions (Signed)
Colonoscopy Discharge Instructions  Read the instructions outlined below and refer to this sheet in the next few weeks. These discharge instructions provide you with general information on caring for yourself after you leave the hospital. Your doctor may also give you specific instructions. While your treatment has been planned according to the most current medical practices available, unavoidable complications occasionally occur. If you have any problems or questions after discharge, call Dr. Gala Dixon at [phone]. ACTIVITY  You may resume your regular activity, but move at a slower pace for the next 24 hours.   Take frequent rest periods for the next 24 hours.   Walking will help get rid of the air and reduce the bloated feeling in your belly (abdomen).   No driving for 24 hours (because of the medicine (anesthesia) used during the test).    Do not sign any important legal documents or operate any machinery for 24 hours (because of the anesthesia used during the test).  NUTRITION  Drink plenty of fluids.   You may resume your normal diet as instructed by your doctor.   Begin with a light meal and progress to your normal diet. Heavy or fried foods are harder to digest and may make you feel sick to your stomach (nauseated).   Avoid alcoholic beverages for 24 hours or as instructed.  MEDICATIONS  You may resume your normal medications unless your doctor tells you otherwise.  WHAT YOU CAN EXPECT TODAY  Some feelings of bloating in the abdomen.   Passage of more gas than usual.   Spotting of blood in your stool or on the toilet paper.  IF YOU HAD POLYPS REMOVED DURING THE COLONOSCOPY:  No aspirin products for 7 days or as instructed.   No alcohol for 7 days or as instructed.   Eat a soft diet for the next 24 hours.  FINDING OUT THE RESULTS OF YOUR TEST Not all test results are available during your visit. If your test results are not back during the visit, make an appointment  with your caregiver to find out the results. Do not assume everything is normal if you have not heard from your caregiver or the medical facility. It is important for you to follow up on all of your test results.  SEEK IMMEDIATE MEDICAL ATTENTION IF:  You have more than a spotting of blood in your stool.   Your belly is swollen (abdominal distention).   You are nauseated or vomiting.   You have a temperature over 101.   You have abdominal pain or discomfort that is severe or gets worse throughout the day.    I would recommend one more  Colonoscopy in 5 years only if overall health permits  PATIENT INSTRUCTIONS POST-ANESTHESIA  IMMEDIATELY FOLLOWING SURGERY:  Do not drive or operate machinery for the first twenty four hours after surgery.  Do not make any important decisions for twenty four hours after surgery or while taking narcotic pain medications or sedatives.  If you develop intractable nausea and vomiting or a severe headache please notify your doctor immediately.  FOLLOW-UP:  Please make an appointment with your surgeon as instructed. You do not need to follow up with anesthesia unless specifically instructed to do so.  WOUND CARE INSTRUCTIONS (if applicable):  Keep a dry clean dressing on the anesthesia/puncture wound site if there is drainage.  Once the wound has quit draining you may leave it open to air.  Generally you should leave the bandage intact for twenty four hours  unless there is drainage.  If the epidural site drains for more than 36-48 hours please call the anesthesia department.  QUESTIONS?:  Please feel free to call your physician or the hospital operator if you have any questions, and they will be happy to assist you.      Colonoscopy, Adult, Care After This sheet gives you information about how to care for yourself after your procedure. Your doctor may also give you more specific instructions. If you have problems or questions, call your doctor. Follow these  instructions at home: General instructions   For the first 24 hours after the procedure: ? Do not drive or use machinery. ? Do not sign important documents. ? Do not drink alcohol. ? Do your daily activities more slowly than normal. ? Eat foods that are soft and easy to digest. ? Rest often.  Take over-the-counter or prescription medicines only as told by your doctor.  It is up to you to get the results of your procedure. Ask your doctor, or the department performing the procedure, when your results will be ready. To help cramping and bloating:  Try walking around.  Put heat on your belly (abdomen) as told by your doctor. Use a heat source that your doctor recommends, such as a moist heat pack or a heating pad. ? Put a towel between your skin and the heat source. ? Leave the heat on for 20-30 minutes. ? Remove the heat if your skin turns bright red. This is especially important if you cannot feel pain, heat, or cold. You can get burned. Eating and drinking  Drink enough fluid to keep your pee (urine) clear or pale yellow.  Return to your normal diet as told by your doctor. Avoid heavy or fried foods that are hard to digest.  Avoid drinking alcohol for as long as told by your doctor. Contact a doctor if:  You have blood in your poop (stool) 2-3 days after the procedure. Get help right away if:  You have more than a small amount of blood in your poop.  You see large clumps of tissue (blood clots) in your poop.  Your belly is swollen.  You feel sick to your stomach (nauseous).  You throw up (vomit).  You have a fever.  You have belly pain that gets worse, and medicine does not help your pain. This information is not intended to replace advice given to you by your health care provider. Make sure you discuss any questions you have with your health care provider. Document Released: 03/19/2010 Document Revised: 11/09/2015 Document Reviewed: 11/09/2015 Elsevier Interactive  Patient Education  2017 [employer].

## 2017-08-16 NOTE — H&P (Signed)
@LOGO @   Primary Care Physician:  Sharilyn Sites, MD Primary Gastroenterologist:  Dr. Gala Romney  Pre-Procedure History & Physical: HPI:  Ronald Dixon is a 72 y.o. male here for for high-risk screening colonoscopy. Brother diagnosed with colon cancer At age 64. Negative colonoscopy 2005.  Past Medical History:  Diagnosis Date  . Hypercholesteremia   . Hypertension      Prior to Admission medications   Medication Sig Start Date End Date Taking? Authorizing Provider  aspirin EC 81 MG tablet Take 81 mg by mouth daily.   Yes [provider]  Cholecalciferol (VITAMIN D3) 1000 units CAPS Take 1,000 Units by mouth daily.    Yes [provider]  Gluc-Chonn-MSM-Boswellia-Vit D (GLUCOSAMINE CHOND TRIPLE/VIT D) TABS Take 1 tablet by mouth daily.    Yes [provider]  losartan-hydrochlorothiazide (HYZAAR) 100-25 MG tablet Take 1 tablet by mouth daily.  05/20/17  Yes [provider]  naproxen sodium (ALEVE) 220 MG tablet Take 220 mg by mouth daily as needed (for pain or headache).    Yes [provider]  polyethylene glycol-electrolytes (TRILYTE) 420 g solution Take 4,000 mLs by mouth as directed. 05/24/17  Yes Carlis Stable, NP  simvastatin (ZOCOR) 40 MG tablet Take 40 mg by mouth daily.  03/04/17  Yes [provider]    Allergies as of 05/24/2017  . (No Known Allergies)    No family history on file.  Social History   Socioeconomic History  . Marital status: Married    Spouse name: Not on file  . Number of children: Not on file  . Years of education: Not on file  . Highest education level: Not on file  Occupational History  . Not on file  Social Needs  . Financial resource strain: Not on file  . Food insecurity:    Worry: Not on file    Inability: Not on file  . Transportation needs:    Medical: Not on file    Non-medical: Not on file  Tobacco Use  . Smoking status: Not on file  Substance and Sexual Activity  . Alcohol use:  Not on file  . Drug use: Not on file  . Sexual activity: Not on file  Lifestyle  . Physical activity:    Days per week: Not on file    Minutes per session: Not on file  . Stress: Not on file  Relationships  . Social connections:    Talks on phone: Not on file    Gets together: Not on file    Attends religious service: Not on file    Active member of club or organization: Not on file    Attends meetings of clubs or organizations: Not on file    Relationship status: Not on file  . Intimate partner violence:    Fear of current or ex partner: Not on file    Emotionally abused: Not on file    Physically abused: Not on file    Forced sexual activity: Not on file  Other Topics Concern  . Not on file  Social History Narrative  . Not on file    Review of Systems: See HPI, otherwise negative ROS  Physical Exam: There were no vitals taken for this visit. General:   Alert,  Well-developed, well-nourished, pleasant and cooperative in NAD Neck:  Supple; no masses or thyromegaly. No significant cervical adenopathy. Lungs:  Clear throughout to auscultation.   No wheezes, crackles, or rhonchi. No acute distress. Heart:  Regular rate  and rhythm; no murmurs, clicks, rubs,  or gallops. Abdomen: Non-distended, normal bowel sounds.  Soft and nontender without appreciable mass or hepatosplenomegaly.  Pulses:  Normal pulses noted. Extremities:  Without clubbing or edema.  Impression:  72 year old gentleman with a  Positive family history of colon cancer;  Overdue for screening examination.  Recommendations:  I have offered the patient a Screening colonoscopy today.  The risks, benefits, limitations, alternatives and imponderables have been reviewed with the patient. Questions have been answered. All parties are agreeable.       Notice: This dictation was prepared with Dragon dictation along with smaller phrase technology. Any transcriptional errors that result from this process are  unintentional and may not be corrected upon review.

## 2017-08-16 NOTE — Op Note (Addendum)
[facility] Patient Name: Ronald Dixon Procedure Date: 08/16/2017 9:01 AM MRN: [id number] Date of Birth: February 26, 1946 Attending MD: Norvel Richards , MD CSN: [id number] Age: 72 Admit Type: Outpatient Procedure:                Colonoscopy Indications:              Screening in patient at increased risk: Family                            history of 1st-degree relative with colorectal                            cancer before age 60 years Providers:                Norvel Richards, MD, Rosina Lowenstein, RN, Aram Candela Referring MD:              Medicines:                Midazolam 3.5 mg IV, Meperidine 75 mg IV,                            Ondansetron 4 mg IV Complications:            No immediate complications. Estimated Blood Loss:     Estimated blood loss: none. Procedure:                Pre-Anesthesia Assessment:                           - Prior to the procedure, a History and Physical                            was performed, and patient medications and                            allergies were reviewed. The patient's tolerance of                            previous anesthesia was also reviewed. The risks                            and benefits of the procedure and the sedation                            options and risks were discussed with the patient.                            All questions were answered, and informed consent                            was obtained. Prior Anticoagulants: The patient has  taken no previous anticoagulant or antiplatelet                            agents. ASA Grade Assessment: II - A patient with                            mild systemic disease. After reviewing the risks                            and benefits, the patient was deemed in                            satisfactory condition to undergo the procedure.                           After obtaining informed consent, the  colonoscope                            was passed under direct vision. Throughout the                            procedure, the patient's blood pressure, pulse, and                            oxygen saturations were monitored continuously. The                            EC-[id number] ([id number]) scope was introduced through                            the anus and advanced to the the cecum, identified                            by appendiceal orifice and ileocecal valve. The                            colonoscopy was performed without difficulty. The                            patient tolerated the procedure well. The quality                            of the bowel preparation was adequate. Scope In: 9:28:05 AM Scope Out: 9:44:17 AM Scope Withdrawal Time: 0 hours 6 minutes 23 seconds  Total Procedure Duration: 0 hours 16 minutes 12 seconds  Findings:      The perianal and digital rectal examinations were normal.      The entire examined colon appeared normal on direct and retroflexion       views. Impression:               - The entire examined colon is normal on direct and                            retroflexion views.                           -  No specimens collected. Moderate Sedation:      Moderate (conscious) sedation was administered by the endoscopy nurse       and supervised by the endoscopist. The following parameters were       monitored: oxygen saturation, heart rate, blood pressure, respiratory       rate, EKG, adequacy of pulmonary ventilation, and response to care.       Total physician intraservice time was 10 minutes. Recommendation:           - Patient has a contact number available for                            emergencies. The signs and symptoms of potential                            delayed complications were discussed with the                            patient. Return to normal activities tomorrow.                            Written discharge instructions were  provided to the                            patient.                           - Resume previous diet.                           - Continue present medications.                           - Repeat colonoscopy in 5 years for screening                            purposes - only of overall health permits..                           - Return to GI office PRN. Procedure Code(s):        --- Professional ---                           [phone], Colonoscopy, flexible; diagnostic, including                            collection of specimen(s) by brushing or washing,                            when performed (separate procedure)                           G0500, Moderate sedation services provided by the                            same physician or other qualified health care  professional performing a gastrointestinal                            endoscopic service that sedation supports,                            requiring the presence of an independent trained                            observer to assist in the monitoring of the                            patient's level of consciousness and physiological                            status; initial 15 minutes of intra-service time;                            patient age 6 years or older (additional time may                            be reported with [phone], as appropriate) Diagnosis Code(s):        --- Professional ---                           Z80.0, Family history of malignant neoplasm of                            digestive organs CPT copyright 2017 [employer] Association. All rights reserved. The codes documented in this report are preliminary and upon coder review may  be revised to meet current compliance requirements. Cristopher Estimable. Gionni Vaca, MD Norvel Richards, MD 08/16/2017 9:53:03 AM This report has been signed electronically. Number of Addenda: 0

## 2017-08-21 ENCOUNTER — Encounter (HOSPITAL_COMMUNITY): Payer: Self-pay | Admitting: Internal Medicine

## 2017-10-25 DIAGNOSIS — H52223 Regular astigmatism, bilateral: Secondary | ICD-10-CM | POA: Diagnosis not present

## 2017-10-25 DIAGNOSIS — H524 Presbyopia: Secondary | ICD-10-CM | POA: Diagnosis not present

## 2017-10-25 DIAGNOSIS — H43813 Vitreous degeneration, bilateral: Secondary | ICD-10-CM | POA: Diagnosis not present

## 2017-10-25 DIAGNOSIS — H5203 Hypermetropia, bilateral: Secondary | ICD-10-CM | POA: Diagnosis not present

## 2017-12-11 DIAGNOSIS — Z85828 Personal history of other malignant neoplasm of skin: Secondary | ICD-10-CM | POA: Diagnosis not present

## 2017-12-11 DIAGNOSIS — L814 Other melanin hyperpigmentation: Secondary | ICD-10-CM | POA: Diagnosis not present

## 2017-12-11 DIAGNOSIS — L570 Actinic keratosis: Secondary | ICD-10-CM | POA: Diagnosis not present

## 2017-12-11 DIAGNOSIS — L821 Other seborrheic keratosis: Secondary | ICD-10-CM | POA: Diagnosis not present

## 2017-12-26 DIAGNOSIS — Z23 Encounter for immunization: Secondary | ICD-10-CM | POA: Diagnosis not present

## 2018-03-30 DIAGNOSIS — Z0100 Encounter for examination of eyes and vision without abnormal findings: Secondary | ICD-10-CM | POA: Diagnosis not present

## 2018-04-30 DIAGNOSIS — Z6830 Body mass index (BMI) 30.0-30.9, adult: Secondary | ICD-10-CM | POA: Diagnosis not present

## 2018-04-30 DIAGNOSIS — Z1389 Encounter for screening for other disorder: Secondary | ICD-10-CM | POA: Diagnosis not present

## 2018-04-30 DIAGNOSIS — E6609 Other obesity due to excess calories: Secondary | ICD-10-CM | POA: Diagnosis not present

## 2018-04-30 DIAGNOSIS — I10 Essential (primary) hypertension: Secondary | ICD-10-CM | POA: Diagnosis not present

## 2018-04-30 DIAGNOSIS — E7849 Other hyperlipidemia: Secondary | ICD-10-CM | POA: Diagnosis not present

## 2018-04-30 DIAGNOSIS — R7309 Other abnormal glucose: Secondary | ICD-10-CM | POA: Diagnosis not present

## 2018-04-30 DIAGNOSIS — Z0001 Encounter for general adult medical examination with abnormal findings: Secondary | ICD-10-CM | POA: Diagnosis not present

## 2018-07-24 DIAGNOSIS — L738 Other specified follicular disorders: Secondary | ICD-10-CM | POA: Diagnosis not present

## 2018-07-24 DIAGNOSIS — Z85828 Personal history of other malignant neoplasm of skin: Secondary | ICD-10-CM | POA: Diagnosis not present

## 2018-07-24 DIAGNOSIS — L814 Other melanin hyperpigmentation: Secondary | ICD-10-CM | POA: Diagnosis not present

## 2018-07-24 DIAGNOSIS — D225 Melanocytic nevi of trunk: Secondary | ICD-10-CM | POA: Diagnosis not present

## 2018-07-24 DIAGNOSIS — L821 Other seborrheic keratosis: Secondary | ICD-10-CM | POA: Diagnosis not present

## 2018-07-24 DIAGNOSIS — L570 Actinic keratosis: Secondary | ICD-10-CM | POA: Diagnosis not present

## 2018-07-24 DIAGNOSIS — D1801 Hemangioma of skin and subcutaneous tissue: Secondary | ICD-10-CM | POA: Diagnosis not present

## 2018-09-04 DIAGNOSIS — R69 Illness, unspecified: Secondary | ICD-10-CM | POA: Diagnosis not present

## 2018-09-14 DIAGNOSIS — Z6829 Body mass index (BMI) 29.0-29.9, adult: Secondary | ICD-10-CM | POA: Diagnosis not present

## 2018-09-14 DIAGNOSIS — B029 Zoster without complications: Secondary | ICD-10-CM | POA: Diagnosis not present

## 2018-09-14 DIAGNOSIS — Z1389 Encounter for screening for other disorder: Secondary | ICD-10-CM | POA: Diagnosis not present

## 2018-09-14 DIAGNOSIS — E663 Overweight: Secondary | ICD-10-CM | POA: Diagnosis not present

## 2019-01-01 DIAGNOSIS — L218 Other seborrheic dermatitis: Secondary | ICD-10-CM | POA: Diagnosis not present

## 2019-01-01 DIAGNOSIS — Z85828 Personal history of other malignant neoplasm of skin: Secondary | ICD-10-CM | POA: Diagnosis not present

## 2019-01-01 DIAGNOSIS — L570 Actinic keratosis: Secondary | ICD-10-CM | POA: Diagnosis not present

## 2019-01-01 DIAGNOSIS — L738 Other specified follicular disorders: Secondary | ICD-10-CM | POA: Diagnosis not present

## 2019-02-04 DIAGNOSIS — Z23 Encounter for immunization: Secondary | ICD-10-CM | POA: Diagnosis not present

## 2019-02-11 DIAGNOSIS — R69 Illness, unspecified: Secondary | ICD-10-CM | POA: Diagnosis not present

## 2019-03-11 DIAGNOSIS — R69 Illness, unspecified: Secondary | ICD-10-CM | POA: Diagnosis not present

## 2019-04-28 DIAGNOSIS — I130 Hypertensive heart and chronic kidney disease with heart failure and stage 1 through stage 4 chronic kidney disease, or unspecified chronic kidney disease: Secondary | ICD-10-CM | POA: Diagnosis not present

## 2019-04-28 DIAGNOSIS — N183 Chronic kidney disease, stage 3 unspecified: Secondary | ICD-10-CM | POA: Diagnosis not present

## 2019-04-28 DIAGNOSIS — I5030 Unspecified diastolic (congestive) heart failure: Secondary | ICD-10-CM | POA: Diagnosis not present

## 2019-04-28 DIAGNOSIS — E7849 Other hyperlipidemia: Secondary | ICD-10-CM | POA: Diagnosis not present

## 2019-05-01 DIAGNOSIS — D0462 Carcinoma in situ of skin of left upper limb, including shoulder: Secondary | ICD-10-CM | POA: Diagnosis not present

## 2019-05-01 DIAGNOSIS — L821 Other seborrheic keratosis: Secondary | ICD-10-CM | POA: Diagnosis not present

## 2019-05-01 DIAGNOSIS — Z85828 Personal history of other malignant neoplasm of skin: Secondary | ICD-10-CM | POA: Diagnosis not present

## 2019-05-01 DIAGNOSIS — L738 Other specified follicular disorders: Secondary | ICD-10-CM | POA: Diagnosis not present

## 2019-05-01 DIAGNOSIS — L570 Actinic keratosis: Secondary | ICD-10-CM | POA: Diagnosis not present

## 2019-05-01 DIAGNOSIS — D485 Neoplasm of uncertain behavior of skin: Secondary | ICD-10-CM | POA: Diagnosis not present

## 2019-05-02 DIAGNOSIS — E6609 Other obesity due to excess calories: Secondary | ICD-10-CM | POA: Diagnosis not present

## 2019-05-02 DIAGNOSIS — N183 Chronic kidney disease, stage 3 unspecified: Secondary | ICD-10-CM | POA: Diagnosis not present

## 2019-05-02 DIAGNOSIS — R7309 Other abnormal glucose: Secondary | ICD-10-CM | POA: Diagnosis not present

## 2019-05-02 DIAGNOSIS — Z0001 Encounter for general adult medical examination with abnormal findings: Secondary | ICD-10-CM | POA: Diagnosis not present

## 2019-05-02 DIAGNOSIS — E559 Vitamin D deficiency, unspecified: Secondary | ICD-10-CM | POA: Diagnosis not present

## 2019-05-02 DIAGNOSIS — Z1389 Encounter for screening for other disorder: Secondary | ICD-10-CM | POA: Diagnosis not present

## 2019-05-02 DIAGNOSIS — E7849 Other hyperlipidemia: Secondary | ICD-10-CM | POA: Diagnosis not present

## 2019-05-02 DIAGNOSIS — Z125 Encounter for screening for malignant neoplasm of prostate: Secondary | ICD-10-CM | POA: Diagnosis not present

## 2019-05-02 DIAGNOSIS — I5020 Unspecified systolic (congestive) heart failure: Secondary | ICD-10-CM | POA: Diagnosis not present

## 2019-05-02 DIAGNOSIS — Z6830 Body mass index (BMI) 30.0-30.9, adult: Secondary | ICD-10-CM | POA: Diagnosis not present

## 2019-05-02 DIAGNOSIS — I10 Essential (primary) hypertension: Secondary | ICD-10-CM | POA: Diagnosis not present

## 2019-06-04 DIAGNOSIS — R69 Illness, unspecified: Secondary | ICD-10-CM | POA: Diagnosis not present

## 2019-06-28 DIAGNOSIS — I5030 Unspecified diastolic (congestive) heart failure: Secondary | ICD-10-CM | POA: Diagnosis not present

## 2019-06-28 DIAGNOSIS — N183 Chronic kidney disease, stage 3 unspecified: Secondary | ICD-10-CM | POA: Diagnosis not present

## 2019-06-28 DIAGNOSIS — E7849 Other hyperlipidemia: Secondary | ICD-10-CM | POA: Diagnosis not present

## 2019-06-28 DIAGNOSIS — I130 Hypertensive heart and chronic kidney disease with heart failure and stage 1 through stage 4 chronic kidney disease, or unspecified chronic kidney disease: Secondary | ICD-10-CM | POA: Diagnosis not present

## 2019-07-08 DIAGNOSIS — H40013 Open angle with borderline findings, low risk, bilateral: Secondary | ICD-10-CM | POA: Diagnosis not present

## 2019-08-13 DIAGNOSIS — R69 Illness, unspecified: Secondary | ICD-10-CM | POA: Diagnosis not present

## 2019-08-19 DIAGNOSIS — Z85828 Personal history of other malignant neoplasm of skin: Secondary | ICD-10-CM | POA: Diagnosis not present

## 2019-08-19 DIAGNOSIS — D225 Melanocytic nevi of trunk: Secondary | ICD-10-CM | POA: Diagnosis not present

## 2019-08-19 DIAGNOSIS — D2271 Melanocytic nevi of right lower limb, including hip: Secondary | ICD-10-CM | POA: Diagnosis not present

## 2019-08-19 DIAGNOSIS — D1801 Hemangioma of skin and subcutaneous tissue: Secondary | ICD-10-CM | POA: Diagnosis not present

## 2019-08-19 DIAGNOSIS — L738 Other specified follicular disorders: Secondary | ICD-10-CM | POA: Diagnosis not present

## 2019-08-19 DIAGNOSIS — L570 Actinic keratosis: Secondary | ICD-10-CM | POA: Diagnosis not present

## 2019-08-19 DIAGNOSIS — L821 Other seborrheic keratosis: Secondary | ICD-10-CM | POA: Diagnosis not present

## 2019-08-19 DIAGNOSIS — D485 Neoplasm of uncertain behavior of skin: Secondary | ICD-10-CM | POA: Diagnosis not present

## 2019-08-28 DIAGNOSIS — I5030 Unspecified diastolic (congestive) heart failure: Secondary | ICD-10-CM | POA: Diagnosis not present

## 2019-08-28 DIAGNOSIS — N183 Chronic kidney disease, stage 3 unspecified: Secondary | ICD-10-CM | POA: Diagnosis not present

## 2019-08-28 DIAGNOSIS — E7849 Other hyperlipidemia: Secondary | ICD-10-CM | POA: Diagnosis not present

## 2019-08-28 DIAGNOSIS — I130 Hypertensive heart and chronic kidney disease with heart failure and stage 1 through stage 4 chronic kidney disease, or unspecified chronic kidney disease: Secondary | ICD-10-CM | POA: Diagnosis not present

## 2019-08-30 DIAGNOSIS — H2513 Age-related nuclear cataract, bilateral: Secondary | ICD-10-CM | POA: Diagnosis not present

## 2019-08-30 DIAGNOSIS — H43813 Vitreous degeneration, bilateral: Secondary | ICD-10-CM | POA: Diagnosis not present

## 2019-08-30 DIAGNOSIS — H40053 Ocular hypertension, bilateral: Secondary | ICD-10-CM | POA: Diagnosis not present

## 2019-09-23 DIAGNOSIS — R69 Illness, unspecified: Secondary | ICD-10-CM | POA: Diagnosis not present

## 2019-10-08 DIAGNOSIS — R69 Illness, unspecified: Secondary | ICD-10-CM | POA: Diagnosis not present

## 2019-10-14 DIAGNOSIS — H40053 Ocular hypertension, bilateral: Secondary | ICD-10-CM | POA: Diagnosis not present

## 2019-10-14 DIAGNOSIS — H2513 Age-related nuclear cataract, bilateral: Secondary | ICD-10-CM | POA: Diagnosis not present

## 2019-10-14 DIAGNOSIS — H43813 Vitreous degeneration, bilateral: Secondary | ICD-10-CM | POA: Diagnosis not present

## 2019-11-25 DIAGNOSIS — H40053 Ocular hypertension, bilateral: Secondary | ICD-10-CM | POA: Diagnosis not present

## 2019-12-28 DIAGNOSIS — E7849 Other hyperlipidemia: Secondary | ICD-10-CM | POA: Diagnosis not present

## 2019-12-28 DIAGNOSIS — I5030 Unspecified diastolic (congestive) heart failure: Secondary | ICD-10-CM | POA: Diagnosis not present

## 2019-12-28 DIAGNOSIS — N183 Chronic kidney disease, stage 3 unspecified: Secondary | ICD-10-CM | POA: Diagnosis not present

## 2019-12-28 DIAGNOSIS — I130 Hypertensive heart and chronic kidney disease with heart failure and stage 1 through stage 4 chronic kidney disease, or unspecified chronic kidney disease: Secondary | ICD-10-CM | POA: Diagnosis not present

## 2020-01-07 DIAGNOSIS — H40053 Ocular hypertension, bilateral: Secondary | ICD-10-CM | POA: Diagnosis not present

## 2020-01-07 DIAGNOSIS — H2513 Age-related nuclear cataract, bilateral: Secondary | ICD-10-CM | POA: Diagnosis not present

## 2020-01-07 DIAGNOSIS — H43813 Vitreous degeneration, bilateral: Secondary | ICD-10-CM | POA: Diagnosis not present

## 2020-01-09 DIAGNOSIS — R69 Illness, unspecified: Secondary | ICD-10-CM | POA: Diagnosis not present

## 2020-01-28 DIAGNOSIS — E7849 Other hyperlipidemia: Secondary | ICD-10-CM | POA: Diagnosis not present

## 2020-01-28 DIAGNOSIS — N183 Chronic kidney disease, stage 3 unspecified: Secondary | ICD-10-CM | POA: Diagnosis not present

## 2020-01-28 DIAGNOSIS — I5030 Unspecified diastolic (congestive) heart failure: Secondary | ICD-10-CM | POA: Diagnosis not present

## 2020-01-28 DIAGNOSIS — I130 Hypertensive heart and chronic kidney disease with heart failure and stage 1 through stage 4 chronic kidney disease, or unspecified chronic kidney disease: Secondary | ICD-10-CM | POA: Diagnosis not present

## 2020-03-10 DIAGNOSIS — L570 Actinic keratosis: Secondary | ICD-10-CM | POA: Diagnosis not present

## 2020-03-10 DIAGNOSIS — L821 Other seborrheic keratosis: Secondary | ICD-10-CM | POA: Diagnosis not present

## 2020-03-10 DIAGNOSIS — D225 Melanocytic nevi of trunk: Secondary | ICD-10-CM | POA: Diagnosis not present

## 2020-03-10 DIAGNOSIS — L738 Other specified follicular disorders: Secondary | ICD-10-CM | POA: Diagnosis not present

## 2020-03-10 DIAGNOSIS — Z85828 Personal history of other malignant neoplasm of skin: Secondary | ICD-10-CM | POA: Diagnosis not present

## 2020-03-10 DIAGNOSIS — D1801 Hemangioma of skin and subcutaneous tissue: Secondary | ICD-10-CM | POA: Diagnosis not present

## 2020-03-10 DIAGNOSIS — D2261 Melanocytic nevi of right upper limb, including shoulder: Secondary | ICD-10-CM | POA: Diagnosis not present

## 2020-03-28 DIAGNOSIS — E7849 Other hyperlipidemia: Secondary | ICD-10-CM | POA: Diagnosis not present

## 2020-03-28 DIAGNOSIS — I5030 Unspecified diastolic (congestive) heart failure: Secondary | ICD-10-CM | POA: Diagnosis not present

## 2020-03-28 DIAGNOSIS — N183 Chronic kidney disease, stage 3 unspecified: Secondary | ICD-10-CM | POA: Diagnosis not present

## 2020-03-28 DIAGNOSIS — I130 Hypertensive heart and chronic kidney disease with heart failure and stage 1 through stage 4 chronic kidney disease, or unspecified chronic kidney disease: Secondary | ICD-10-CM | POA: Diagnosis not present

## 2020-04-24 DIAGNOSIS — H40053 Ocular hypertension, bilateral: Secondary | ICD-10-CM | POA: Diagnosis not present

## 2020-04-24 DIAGNOSIS — H43813 Vitreous degeneration, bilateral: Secondary | ICD-10-CM | POA: Diagnosis not present

## 2020-04-24 DIAGNOSIS — H2513 Age-related nuclear cataract, bilateral: Secondary | ICD-10-CM | POA: Diagnosis not present

## 2020-05-04 DIAGNOSIS — E7849 Other hyperlipidemia: Secondary | ICD-10-CM | POA: Diagnosis not present

## 2020-05-04 DIAGNOSIS — I10 Essential (primary) hypertension: Secondary | ICD-10-CM | POA: Diagnosis not present

## 2020-05-04 DIAGNOSIS — R7309 Other abnormal glucose: Secondary | ICD-10-CM | POA: Diagnosis not present

## 2020-05-04 DIAGNOSIS — Z125 Encounter for screening for malignant neoplasm of prostate: Secondary | ICD-10-CM | POA: Diagnosis not present

## 2020-05-04 DIAGNOSIS — E559 Vitamin D deficiency, unspecified: Secondary | ICD-10-CM | POA: Diagnosis not present

## 2020-05-04 DIAGNOSIS — Z6830 Body mass index (BMI) 30.0-30.9, adult: Secondary | ICD-10-CM | POA: Diagnosis not present

## 2020-05-04 DIAGNOSIS — Z0001 Encounter for general adult medical examination with abnormal findings: Secondary | ICD-10-CM | POA: Diagnosis not present

## 2020-05-27 DIAGNOSIS — I5030 Unspecified diastolic (congestive) heart failure: Secondary | ICD-10-CM | POA: Diagnosis not present

## 2020-05-27 DIAGNOSIS — N183 Chronic kidney disease, stage 3 unspecified: Secondary | ICD-10-CM | POA: Diagnosis not present

## 2020-05-27 DIAGNOSIS — I130 Hypertensive heart and chronic kidney disease with heart failure and stage 1 through stage 4 chronic kidney disease, or unspecified chronic kidney disease: Secondary | ICD-10-CM | POA: Diagnosis not present

## 2020-05-27 DIAGNOSIS — E7849 Other hyperlipidemia: Secondary | ICD-10-CM | POA: Diagnosis not present

## 2020-06-08 DIAGNOSIS — I10 Essential (primary) hypertension: Secondary | ICD-10-CM | POA: Diagnosis not present

## 2020-06-08 DIAGNOSIS — N183 Chronic kidney disease, stage 3 unspecified: Secondary | ICD-10-CM | POA: Diagnosis not present

## 2020-07-28 DIAGNOSIS — I130 Hypertensive heart and chronic kidney disease with heart failure and stage 1 through stage 4 chronic kidney disease, or unspecified chronic kidney disease: Secondary | ICD-10-CM | POA: Diagnosis not present

## 2020-07-28 DIAGNOSIS — N183 Chronic kidney disease, stage 3 unspecified: Secondary | ICD-10-CM | POA: Diagnosis not present

## 2020-07-28 DIAGNOSIS — I5030 Unspecified diastolic (congestive) heart failure: Secondary | ICD-10-CM | POA: Diagnosis not present

## 2020-07-28 DIAGNOSIS — E7849 Other hyperlipidemia: Secondary | ICD-10-CM | POA: Diagnosis not present

## 2020-08-24 DIAGNOSIS — H40053 Ocular hypertension, bilateral: Secondary | ICD-10-CM | POA: Diagnosis not present

## 2020-08-24 DIAGNOSIS — H43813 Vitreous degeneration, bilateral: Secondary | ICD-10-CM | POA: Diagnosis not present

## 2020-08-24 DIAGNOSIS — H2513 Age-related nuclear cataract, bilateral: Secondary | ICD-10-CM | POA: Diagnosis not present

## 2020-10-26 DIAGNOSIS — L821 Other seborrheic keratosis: Secondary | ICD-10-CM | POA: Diagnosis not present

## 2020-10-26 DIAGNOSIS — Z85828 Personal history of other malignant neoplasm of skin: Secondary | ICD-10-CM | POA: Diagnosis not present

## 2020-10-26 DIAGNOSIS — L570 Actinic keratosis: Secondary | ICD-10-CM | POA: Diagnosis not present

## 2020-10-26 DIAGNOSIS — L309 Dermatitis, unspecified: Secondary | ICD-10-CM | POA: Diagnosis not present

## 2020-10-26 DIAGNOSIS — D225 Melanocytic nevi of trunk: Secondary | ICD-10-CM | POA: Diagnosis not present

## 2020-10-26 DIAGNOSIS — L814 Other melanin hyperpigmentation: Secondary | ICD-10-CM | POA: Diagnosis not present

## 2020-12-07 DIAGNOSIS — R69 Illness, unspecified: Secondary | ICD-10-CM | POA: Diagnosis not present

## 2020-12-07 DIAGNOSIS — I5020 Unspecified systolic (congestive) heart failure: Secondary | ICD-10-CM | POA: Diagnosis not present

## 2020-12-07 DIAGNOSIS — F5104 Psychophysiologic insomnia: Secondary | ICD-10-CM | POA: Diagnosis not present

## 2020-12-07 DIAGNOSIS — R7309 Other abnormal glucose: Secondary | ICD-10-CM | POA: Diagnosis not present

## 2020-12-07 DIAGNOSIS — E782 Mixed hyperlipidemia: Secondary | ICD-10-CM | POA: Diagnosis not present

## 2020-12-07 DIAGNOSIS — E559 Vitamin D deficiency, unspecified: Secondary | ICD-10-CM | POA: Diagnosis not present

## 2020-12-07 DIAGNOSIS — G64 Other disorders of peripheral nervous system: Secondary | ICD-10-CM | POA: Diagnosis not present

## 2020-12-07 DIAGNOSIS — G63 Polyneuropathy in diseases classified elsewhere: Secondary | ICD-10-CM | POA: Diagnosis not present

## 2020-12-07 DIAGNOSIS — F329 Major depressive disorder, single episode, unspecified: Secondary | ICD-10-CM | POA: Diagnosis not present

## 2020-12-07 DIAGNOSIS — E6609 Other obesity due to excess calories: Secondary | ICD-10-CM | POA: Diagnosis not present

## 2020-12-07 DIAGNOSIS — Z125 Encounter for screening for malignant neoplasm of prostate: Secondary | ICD-10-CM | POA: Diagnosis not present

## 2020-12-07 DIAGNOSIS — Z6828 Body mass index (BMI) 28.0-28.9, adult: Secondary | ICD-10-CM | POA: Diagnosis not present

## 2020-12-07 DIAGNOSIS — I10 Essential (primary) hypertension: Secondary | ICD-10-CM | POA: Diagnosis not present

## 2020-12-10 DIAGNOSIS — E291 Testicular hypofunction: Secondary | ICD-10-CM | POA: Diagnosis not present

## 2020-12-24 ENCOUNTER — Ambulatory Visit (INDEPENDENT_AMBULATORY_CARE_PROVIDER_SITE_OTHER): Payer: Medicare HMO | Admitting: Urology

## 2020-12-24 ENCOUNTER — Other Ambulatory Visit: Payer: Self-pay

## 2020-12-24 ENCOUNTER — Encounter: Payer: Self-pay | Admitting: Urology

## 2020-12-24 VITALS — BP 153/72 | HR 64 | Temp 98.7°F | Wt 195.0 lb

## 2020-12-24 DIAGNOSIS — N529 Male erectile dysfunction, unspecified: Secondary | ICD-10-CM | POA: Diagnosis not present

## 2020-12-24 DIAGNOSIS — E291 Testicular hypofunction: Secondary | ICD-10-CM

## 2020-12-24 DIAGNOSIS — R361 Hematospermia: Secondary | ICD-10-CM | POA: Diagnosis not present

## 2020-12-24 DIAGNOSIS — N419 Inflammatory disease of prostate, unspecified: Secondary | ICD-10-CM

## 2020-12-24 LAB — URINALYSIS, ROUTINE W REFLEX MICROSCOPIC
Bilirubin, UA: NEGATIVE
Glucose, UA: NEGATIVE
Leukocytes,UA: NEGATIVE
Nitrite, UA: NEGATIVE
RBC, UA: NEGATIVE
Specific Gravity, UA: >1.03 — ABNORMAL HIGH (ref 1.005–1.030)
Urobilinogen, Ur: 1 mg/dL (ref 0.2–1.0)
pH, UA: 5.5 (ref 5.0–7.5)

## 2020-12-24 MED ORDER — LEVOFLOXACIN 500 MG PO TABS: 500.0000 mg | ORAL_TABLET | Freq: Every day | ORAL | 0 refills | Status: AC

## 2020-12-24 NOTE — Progress Notes (Signed)
Urological Symptom Review  Patient is experiencing the following symptoms: Burning/pain with urination Stream starts and stops Trouble starting stream Weak stream Erection problems (male only)   Review of Systems  Gastrointestinal (upper)  : Negative for upper GI symptoms  Gastrointestinal (lower) : Constipation  Constitutional : Weight loss Fatigue  Skin: Negative for skin symptoms  Eyes: Negative for eye symptoms  Ear/Nose/Throat : Negative for Ear/Nose/Throat symptoms  Hematologic/Lymphatic: Negative for Hematologic/Lymphatic symptoms  Cardiovascular : Negative for cardiovascular symptoms  Respiratory : Negative for respiratory symptoms  Endocrine: Negative for endocrine symptoms  Musculoskeletal: Joint pain  Neurological: Negative for neurological symptoms  Psychologic: Negative for psychiatric symptoms

## 2020-12-24 NOTE — Progress Notes (Signed)
Assessment: 1. Hematospermia   2. Prostatitis, unspecified prostatitis type   3. 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  I advised him that this is typically associated with benign conditions specifically prostatitis.  His exam is consistent with mild prostatitis.  Recommend treatment with antibiotics. Prescription for Levaquin 500 mg daily x21 days sent to his pharmacy. Request records from Dr. Hilma Favors regarding his hypogonadism. Due to the rapid onset of his erectile dysfunction, I think this is more likely psychogenic in nature.  I recommended observation at this time. Return to the office in 6 weeks.  Chief Complaint:  Chief Complaint  Patient presents with   blood in semen     History of Present Illness:  Ronald Dixon is a 75 y.o. year old male who is seen in consultation from Sharilyn Sites, MD for evaluation of hematospermia.  He noted blood in his semen approximately 1 month ago.  No pain with ejaculation.  He has noted some mild dysuria since that time.  He does report some intermittency and hesitancy.  No gross hematuria. AUA score = 7 today. He reports difficulty with achieving erections in the past month.  No prior history of erectile dysfunction. He was started on testosterone replacement therapy approximately 2 weeks ago.  No laboratory results available for review.  PSA from 10/22: 2.2   Past Medical History:  Past Medical History:  Diagnosis Date   Hypercholesteremia    Hypertension     Past Surgical History:  Past Surgical History:  Procedure Laterality Date   COLONOSCOPY     COLONOSCOPY N/A 08/16/2017   Procedure: COLONOSCOPY;  Surgeon: Daneil Dolin, MD;  Location: [facility];  Service: Endoscopy;  Laterality: N/A;  10:30    Allergies:  No Known Allergies  Family History:  Family History  Problem Relation Age of Onset    Colon cancer Brother     Social History:  Social History   Tobacco Use   Smoking status: Never   Smokeless tobacco: Never  Vaping Use   Vaping Use: Never used  Substance Use Topics   Alcohol use: Never   Drug use: Never    Review of symptoms:  Constitutional:  Negative for unexplained weight loss, night sweats, fever, chills ENT:  Negative for nose bleeds, sinus pain, painful swallowing CV:  Negative for chest pain, shortness of breath, exercise intolerance, palpitations, loss of consciousness Resp:  Negative for cough, wheezing, shortness of breath GI:  Negative for nausea, vomiting, diarrhea, bloody stools GU:  Positives noted in HPI; otherwise negative for gross hematuria, urinary incontinence Neuro:  Negative for seizures, poor balance, limb weakness, slurred speech Psych:  Negative for lack of energy, depression, anxiety Endocrine:  Negative for polydipsia, polyuria, symptoms of hypoglycemia (dizziness, hunger, sweating) Hematologic:  Negative for anemia, purpura, petechia, prolonged or excessive bleeding, use of anticoagulants  Allergic:  Negative for difficulty breathing or choking as a result of exposure to anything; no shellfish allergy; no allergic response (rash/itch) to materials, foods  Physical exam: BP (!) 153/72   Pulse 64   Temp 98.7 F (37.1 C)   Wt 195 lb (88.5 kg)   BMI 28.80 kg/m  GENERAL APPEARANCE:  Well appearing, well developed, well nourished, NAD HEENT: Atraumatic, Normocephalic, oropharynx clear. NECK: Supple without lymphadenopathy or thyromegaly. LUNGS: Clear to auscultation bilaterally. HEART: Regular Rate and Rhythm without murmurs, gallops, or rubs. ABDOMEN: Soft,  non-tender, No Masses. EXTREMITIES: Moves all extremities well.  Without clubbing, cyanosis, or edema. NEUROLOGIC:  Alert and oriented x 3, normal gait, CN II-XII grossly intact.  MENTAL STATUS:  Appropriate. BACK:  Non-tender to palpation.  No CVAT SKIN:  Warm, dry and intact.    GU: Penis:  uncircumcised Meatus: Normal Scrotum: normal, no masses Testis: normal without masses bilateral Epididymis: normal Prostate: 40 g, slightly tender, no nodules Rectum: Normal tone,  no masses or tenderness   Results: Results for orders placed or performed in visit on 12/24/20 (from the past 24 hour(s))  Urinalysis, Routine w reflex microscopic     Status: Abnormal   Collection Time: 12/24/20  4:05 PM  Result Value Ref Range   Specific Gravity, UA >1.030 (H) 1.005 - 1.030   pH, UA 5.5 5.0 - 7.5   Color, UA Yellow Yellow   Appearance Ur Clear Clear   Leukocytes,UA Negative Negative   Protein,UA Trace (A) Negative/Trace   Glucose, UA Negative Negative   Ketones, UA Trace (A) Negative   RBC, UA Negative Negative   Bilirubin, UA Negative Negative   Urobilinogen, Ur 1.0 0.2 - 1.0 mg/dL   Nitrite, UA Negative Negative   Microscopic Examination Comment    Narrative   Performed at:  [facility] [address], [place], [place]  [id number] Lab Director: Mina Marble MT, Phone:  [phone]

## 2021-01-11 DIAGNOSIS — E291 Testicular hypofunction: Secondary | ICD-10-CM | POA: Diagnosis not present

## 2021-01-11 DIAGNOSIS — Z23 Encounter for immunization: Secondary | ICD-10-CM | POA: Diagnosis not present

## 2021-01-18 DIAGNOSIS — E039 Hypothyroidism, unspecified: Secondary | ICD-10-CM | POA: Diagnosis not present

## 2021-01-18 DIAGNOSIS — Z6828 Body mass index (BMI) 28.0-28.9, adult: Secondary | ICD-10-CM | POA: Diagnosis not present

## 2021-01-18 DIAGNOSIS — E663 Overweight: Secondary | ICD-10-CM | POA: Diagnosis not present

## 2021-01-18 DIAGNOSIS — E291 Testicular hypofunction: Secondary | ICD-10-CM | POA: Diagnosis not present

## 2021-02-04 ENCOUNTER — Other Ambulatory Visit: Payer: Self-pay

## 2021-02-04 ENCOUNTER — Encounter: Payer: Self-pay | Admitting: Urology

## 2021-02-04 ENCOUNTER — Ambulatory Visit: Payer: Medicare HMO | Admitting: Urology

## 2021-02-04 VITALS — BP 146/81 | HR 69 | Wt 195.0 lb

## 2021-02-04 DIAGNOSIS — N529 Male erectile dysfunction, unspecified: Secondary | ICD-10-CM

## 2021-02-04 DIAGNOSIS — R361 Hematospermia: Secondary | ICD-10-CM | POA: Diagnosis not present

## 2021-02-04 DIAGNOSIS — E291 Testicular hypofunction: Secondary | ICD-10-CM | POA: Diagnosis not present

## 2021-02-04 DIAGNOSIS — N419 Inflammatory disease of prostate, unspecified: Secondary | ICD-10-CM | POA: Diagnosis not present

## 2021-02-04 LAB — URINALYSIS, ROUTINE W REFLEX MICROSCOPIC
Bilirubin, UA: NEGATIVE
Glucose, UA: NEGATIVE
Ketones, UA: NEGATIVE
Leukocytes,UA: NEGATIVE
Nitrite, UA: NEGATIVE
RBC, UA: NEGATIVE
Specific Gravity, UA: >1.03 — ABNORMAL HIGH (ref 1.005–1.030)
Urobilinogen, Ur: 0.2 mg/dL (ref 0.2–1.0)
pH, UA: 5.5 (ref 5.0–7.5)

## 2021-02-04 NOTE — Progress Notes (Signed)
   Assessment: 1. Hematospermia   2. Prostatitis, unspecified prostatitis type   3. 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 Hilma Favors regarding his hypogonadism. Return to the office in 3 months  Chief Complaint:  Chief Complaint  Patient presents with   hematospermia     History of Present Illness:  Ronald Dixon is a 75 y.o. year old male who is seen for further evaluation of hematospermia.  At his initial visit on 12/24/2020, he reported noting blood in his semen approximately 1 month prior.  No pain with ejaculation.  He had noted some mild dysuria since that time.  He noted some intermittency and hesitancy.  No gross hematuria. AUA score = 7. He reported difficulty with achieving erections x 1 month.  No prior history of erectile dysfunction. He was started on testosterone replacement therapy approximately 2 weeks prior to his visit.  No laboratory results available for review.  PSA from 10/22: 2.2  His prostate exam was consistent with prostatitis.  He was treated with Levaquin x21 days.  He returns today for follow-up.  He has completed the Levaquin.  He reports a decrease in the level of hematospermia.  He has noted some slight blood streaking in his semen.  He is not having any pain with ejaculation.  His erections have improved as well.  He reports only occasional and slight dysuria.  No gross hematuria. AUA score = 5 today.  Portions of the above documentation were copied from a prior visit for review purposes only.   Past Medical History:  Past Medical History:  Diagnosis Date   Hypercholesteremia    Hypertension     Past Surgical History:  Past Surgical History:  Procedure Laterality Date   COLONOSCOPY     COLONOSCOPY N/A 08/16/2017   Procedure: COLONOSCOPY;  Surgeon: Daneil Dolin, MD;  Location: [facility];  Service: Endoscopy;  Laterality: N/A;  10:30    Allergies:   No Known Allergies  Family History:  Family History  Problem Relation Age of Onset   Colon cancer Brother     Social History:  Social History   Tobacco Use   Smoking status: Never   Smokeless tobacco: Never  Vaping Use   Vaping Use: Never used  Substance Use Topics   Alcohol use: Never   Drug use: Never   ROS: Constitutional:  Negative for fever, chills, weight loss CV: Negative for chest pain, previous MI, hypertension Respiratory:  Negative for shortness of breath, wheezing, sleep apnea, frequent cough GI:  Negative for nausea, vomiting, bloody stool, GERD  Physical exam: BP (!) 146/81   Pulse 69   Wt 195 lb (88.5 kg)   BMI 28.80 kg/m  GENERAL APPEARANCE:  Well appearing, well developed, well nourished, NAD HEENT:  Atraumatic, normocephalic, oropharynx clear NECK:  Supple without lymphadenopathy or thyromegaly ABDOMEN:  Soft, non-tender, no masses EXTREMITIES:  Moves all extremities well, without clubbing, cyanosis, or edema NEUROLOGIC:  Alert and oriented x 3, normal gait, CN II-XII grossly intact MENTAL STATUS:  appropriate BACK:  Non-tender to palpation, No CVAT SKIN:  Warm, dry, and intact   Results: U/A:  trace protein

## 2021-02-04 NOTE — Progress Notes (Signed)
Urological Symptom Review  Patient is experiencing the following symptoms: Burning/pain with urination Erection problems (male only)   Review of Systems  Gastrointestinal (upper)  : Negative for upper GI symptoms  Gastrointestinal (lower) : Negative for lower GI symptoms  Constitutional : Negative for symptoms  Skin: Negative for skin symptoms  Eyes: Negative for eye symptoms  Ear/Nose/Throat : Negative for Ear/Nose/Throat symptoms  Hematologic/Lymphatic: Negative for Hematologic/Lymphatic symptoms  Cardiovascular : Negative for cardiovascular symptoms  Respiratory : Negative for respiratory symptoms  Endocrine: Negative for endocrine symptoms  Musculoskeletal: Negative for musculoskeletal symptoms  Neurological: Negative for neurological symptoms  Psychologic: Negative for psychiatric symptoms  

## 2021-02-10 DIAGNOSIS — E291 Testicular hypofunction: Secondary | ICD-10-CM | POA: Diagnosis not present

## 2021-03-02 DIAGNOSIS — H40053 Ocular hypertension, bilateral: Secondary | ICD-10-CM | POA: Diagnosis not present

## 2021-03-02 DIAGNOSIS — H2513 Age-related nuclear cataract, bilateral: Secondary | ICD-10-CM | POA: Diagnosis not present

## 2021-03-02 DIAGNOSIS — H43813 Vitreous degeneration, bilateral: Secondary | ICD-10-CM | POA: Diagnosis not present

## 2021-03-16 DIAGNOSIS — E291 Testicular hypofunction: Secondary | ICD-10-CM | POA: Diagnosis not present

## 2021-03-19 ENCOUNTER — Other Ambulatory Visit: Payer: Self-pay

## 2021-03-19 ENCOUNTER — Ambulatory Visit: Payer: Medicare HMO | Admitting: Urology

## 2021-03-19 ENCOUNTER — Encounter: Payer: Self-pay | Admitting: Urology

## 2021-03-19 VITALS — BP 143/81 | HR 71

## 2021-03-19 DIAGNOSIS — R3129 Other microscopic hematuria: Secondary | ICD-10-CM | POA: Diagnosis not present

## 2021-03-19 DIAGNOSIS — R361 Hematospermia: Secondary | ICD-10-CM | POA: Diagnosis not present

## 2021-03-19 DIAGNOSIS — N529 Male erectile dysfunction, unspecified: Secondary | ICD-10-CM

## 2021-03-19 DIAGNOSIS — N419 Inflammatory disease of prostate, unspecified: Secondary | ICD-10-CM

## 2021-03-19 LAB — MICROSCOPIC EXAMINATION
RBC: >30 /hpf — AB (ref 0–2)
Renal Epithel, UA: NONE SEEN /hpf
WBC, UA: NONE SEEN /hpf (ref 0–5)

## 2021-03-19 LAB — URINALYSIS, ROUTINE W REFLEX MICROSCOPIC
Bilirubin, UA: NEGATIVE
Glucose, UA: NEGATIVE
Leukocytes,UA: NEGATIVE
Nitrite, UA: NEGATIVE
Specific Gravity, UA: >1.03 — ABNORMAL HIGH (ref 1.005–1.030)
Urobilinogen, Ur: 0.2 mg/dL (ref 0.2–1.0)
pH, UA: 5 (ref 5.0–7.5)

## 2021-03-19 MED ORDER — LEVOFLOXACIN 500 MG PO TABS: 500.0000 mg | ORAL_TABLET | Freq: Every day | ORAL | 0 refills | Status: AC

## 2021-03-19 NOTE — Progress Notes (Signed)
Assessment: 1. Hematospermia   2. Microscopic hematuria     Plan: Today I had a discussion with the patient regarding the findings of microscopic hematuria including the implications and differential diagnoses associated with it.  I also discussed recommendations for further evaluation including the rationale for upper tract imaging and cystoscopy.  I discussed the nature of these procedures including potential risk and complications.  The patient expressed an understanding of these issues. BMP today Schedule for CT hematuria protocol followed by cystoscopy. Prescription for Levaquin 500 mg daily x2 weeks  Chief Complaint:  Chief Complaint  Patient presents with   hematospermia   History of Present Illness:  Ronald Dixon is a 76 y.o. year old male who is seen for further evaluation of hematospermia.  At his initial visit on 12/24/2020, he reported noting blood in his semen approximately 1 month prior.  No pain with ejaculation.  He had noted some mild dysuria since that time.  He also noted some intermittency and hesitancy.  No gross hematuria. AUA score = 7. He reported difficulty with achieving erections x 1 month.  No prior history of erectile dysfunction. He was started on testosterone replacement therapy approximately 2 weeks prior to his visit.  No laboratory results available for review.  PSA from 10/22: 2.2  His prostate exam was consistent with prostatitis.  He was treated with Levaquin x 21 days.  At his visit in 12/22, he had completed the Levaquin.  He reported a decrease in the level of hematospermia.  He noted some slight blood streaking in his semen.  He was not having any pain with ejaculation.  His erections had improved as well.  He reported only occasional slight dysuria.  No gross hematuria. AUA score = 5.  He returns today for follow-up.  He has noted some increased blood in his semen again.  He also has some dysuria.  He is also noted a bulge in the left  inguinal area and is concerned about a hernia.  No gross hematuria or flank pain.  No change in his urinary symptoms. AUA score = 6 today.  Portions of the above documentation were copied from a prior visit for review purposes only.   Past Medical History:  Past Medical History:  Diagnosis Date   Hypercholesteremia    Hypertension     Past Surgical History:  Past Surgical History:  Procedure Laterality Date   COLONOSCOPY     COLONOSCOPY N/A 08/16/2017   Procedure: COLONOSCOPY;  Surgeon: Daneil Dolin, MD;  Location: [facility];  Service: Endoscopy;  Laterality: N/A;  10:30    Allergies:  No Known Allergies  Family History:  Family History  Problem Relation Age of Onset   Colon cancer Brother     Social History:  Social History   Tobacco Use   Smoking status: Never   Smokeless tobacco: Never  Vaping Use   Vaping Use: Never used  Substance Use Topics   Alcohol use: Never   Drug use: Never   ROS: Constitutional:  Negative for fever, chills, weight loss CV: Negative for chest pain, previous MI, hypertension Respiratory:  Negative for shortness of breath, wheezing, sleep apnea, frequent cough GI:  Negative for nausea, vomiting, bloody stool, GERD  Physical exam: BP (!) 143/81    Pulse 71  GENERAL APPEARANCE:  Well appearing, well developed, well nourished, NAD HEENT:  Atraumatic, normocephalic, oropharynx clear NECK:  Supple without lymphadenopathy or thyromegaly ABDOMEN:  Soft, non-tender, no masses EXTREMITIES:  Moves all extremities well, without clubbing, cyanosis, or edema NEUROLOGIC:  Alert and oriented x 3, normal gait, CN II-XII grossly intact MENTAL STATUS:  appropriate BACK:  Non-tender to palpation, No CVAT SKIN:  Warm, dry, and intact GU: Penis:  uncircumcised Meatus: Normal Scrotum: normal, no masses Testis: normal without masses bilateral Epididymis: normal Prostate: 30 g, NT, no nodules Rectum: Normal tone,  no masses or  tenderness   Results: U/A:  >30 RBC, few bacteria

## 2021-03-19 NOTE — Progress Notes (Signed)
Urological Symptom Review  Patient is experiencing the following symptoms: Burning/pain with urination Stream starts and stops   Review of Systems  Gastrointestinal (upper)  : Negative for upper GI symptoms  Gastrointestinal (lower) : Constipation  Constitutional : Negative for symptoms  Skin: Negative for skin symptoms  Eyes: Negative for eye symptoms  Ear/Nose/Throat : Negative for Ear/Nose/Throat symptoms  Hematologic/Lymphatic: Negative for Hematologic/Lymphatic symptoms  Cardiovascular : Negative for cardiovascular symptoms  Respiratory : Negative for respiratory symptoms  Endocrine: Negative for endocrine symptoms  Musculoskeletal: Joint pain  Neurological: Negative for neurological symptoms  Psychologic: Negative for psychiatric symptoms

## 2021-03-24 LAB — BASIC METABOLIC PANEL
BUN/Creatinine Ratio: 25 — ABNORMAL HIGH (ref 10–24)
BUN: 17 mg/dL (ref 8–27)
CO2: 18 mmol/L — ABNORMAL LOW (ref 20–29)
Calcium: 9.5 mg/dL (ref 8.6–10.2)
Chloride: 104 mmol/L (ref 96–106)
Creatinine, Ser: 0.68 mg/dL — ABNORMAL LOW (ref 0.76–1.27)
Glucose: 188 mg/dL — ABNORMAL HIGH (ref 70–99)
Potassium: 4.3 mmol/L (ref 3.5–5.2)
Sodium: 141 mmol/L (ref 134–144)
eGFR: 98 mL/min/{1.73_m2} (ref >59)

## 2021-04-05 DIAGNOSIS — L821 Other seborrheic keratosis: Secondary | ICD-10-CM | POA: Diagnosis not present

## 2021-04-05 DIAGNOSIS — L570 Actinic keratosis: Secondary | ICD-10-CM | POA: Diagnosis not present

## 2021-04-05 DIAGNOSIS — C4441 Basal cell carcinoma of skin of scalp and neck: Secondary | ICD-10-CM | POA: Diagnosis not present

## 2021-04-05 DIAGNOSIS — D225 Melanocytic nevi of trunk: Secondary | ICD-10-CM | POA: Diagnosis not present

## 2021-04-05 DIAGNOSIS — Z85828 Personal history of other malignant neoplasm of skin: Secondary | ICD-10-CM | POA: Diagnosis not present

## 2021-04-05 DIAGNOSIS — L814 Other melanin hyperpigmentation: Secondary | ICD-10-CM | POA: Diagnosis not present

## 2021-04-16 DIAGNOSIS — E291 Testicular hypofunction: Secondary | ICD-10-CM | POA: Diagnosis not present

## 2021-04-20 ENCOUNTER — Ambulatory Visit (HOSPITAL_COMMUNITY)
Admission: RE | Admit: 2021-04-20 | Discharge: 2021-04-20 | Disposition: A | Discharge status: Home / Self Care | Payer: Medicare HMO | Source: Ambulatory Visit | Attending: Urology | Admitting: Urology

## 2021-04-20 ENCOUNTER — Other Ambulatory Visit: Payer: Self-pay

## 2021-04-20 DIAGNOSIS — R3129 Other microscopic hematuria: Secondary | ICD-10-CM | POA: Insufficient documentation

## 2021-04-20 DIAGNOSIS — N400 Enlarged prostate without lower urinary tract symptoms: Secondary | ICD-10-CM | POA: Diagnosis not present

## 2021-04-20 DIAGNOSIS — R319 Hematuria, unspecified: Secondary | ICD-10-CM | POA: Diagnosis not present

## 2021-04-20 LAB — POCT I-STAT CREATININE: Creatinine, Ser: 1.4 mg/dL — ABNORMAL HIGH (ref 0.61–1.24)

## 2021-04-20 MED ORDER — IOHEXOL 300 MG/ML  SOLN
100.0000 mL | Freq: Once | INTRAMUSCULAR | Status: AC | PRN
  Administered 2021-04-20: 100 mL via INTRAVENOUS

## 2021-04-28 ENCOUNTER — Ambulatory Visit: Payer: Medicare HMO | Admitting: Urology

## 2021-04-29 ENCOUNTER — Other Ambulatory Visit: Payer: Self-pay

## 2021-04-29 ENCOUNTER — Encounter: Payer: Self-pay | Admitting: Urology

## 2021-04-29 ENCOUNTER — Ambulatory Visit: Payer: Medicare HMO | Admitting: Urology

## 2021-04-29 VITALS — BP 145/85 | HR 65 | Wt 202.0 lb

## 2021-04-29 DIAGNOSIS — R8289 Other abnormal findings on cytological and histological examination of urine: Secondary | ICD-10-CM | POA: Diagnosis not present

## 2021-04-29 DIAGNOSIS — R361 Hematospermia: Secondary | ICD-10-CM | POA: Diagnosis not present

## 2021-04-29 DIAGNOSIS — N419 Inflammatory disease of prostate, unspecified: Secondary | ICD-10-CM

## 2021-04-29 DIAGNOSIS — Z0100 Encounter for examination of eyes and vision without abnormal findings: Secondary | ICD-10-CM | POA: Diagnosis not present

## 2021-04-29 DIAGNOSIS — R3129 Other microscopic hematuria: Secondary | ICD-10-CM | POA: Diagnosis not present

## 2021-04-29 DIAGNOSIS — E291 Testicular hypofunction: Secondary | ICD-10-CM

## 2021-04-29 DIAGNOSIS — N529 Male erectile dysfunction, unspecified: Secondary | ICD-10-CM

## 2021-04-29 LAB — URINALYSIS, ROUTINE W REFLEX MICROSCOPIC
Bilirubin, UA: NEGATIVE
Glucose, UA: NEGATIVE
Leukocytes,UA: NEGATIVE
Nitrite, UA: NEGATIVE
Specific Gravity, UA: >1.03 — ABNORMAL HIGH (ref 1.005–1.030)
Urobilinogen, Ur: 1 mg/dL (ref 0.2–1.0)
pH, UA: 6 (ref 5.0–7.5)

## 2021-04-29 LAB — MICROSCOPIC EXAMINATION
Bacteria, UA: NONE SEEN
RBC: >30 /hpf — AB (ref 0–2)
Renal Epithel, UA: NONE SEEN /hpf
WBC, UA: NONE SEEN /hpf (ref 0–5)

## 2021-04-29 MED ORDER — CIPROFLOXACIN HCL 500 MG PO TABS
500.0000 mg | ORAL_TABLET | Freq: Once | ORAL | Status: AC
  Administered 2021-04-29: 500 mg via ORAL

## 2021-04-29 NOTE — Progress Notes (Signed)
? ?Assessment: ?1. Microscopic hematuria   ?2. Hematospermia   ? ? ?Plan: ?I personally reviewed the CT study from 04/20/2021 showing no evidence of abnormality involving the kidneys or ureters.  I discussed these results as well as the findings on cystoscopy with the patient.  He does have some abnormal appearing mucosa involving the prostatic urethra. ?Urine cytology sent. ?Cipro x1 following cystoscopy. ?PSA today ?Given his ongoing hematospermia and the changes noted in the prostatic urethra, I would like to evaluate him further with a MRI of the prostate. ?I also discussed the need for further evaluation with a TUR of the prostatic urethra. ?I will contact him with results once the MRI has been obtained. ? ?Chief Complaint:  ?Chief Complaint  ?Patient presents with  ? Hematuria  ? ?History of Present Illness: ? ?Ronald Dixon is a 76 y.o. year old male who is seen for further evaluation of hematospermia.  At his initial visit on 12/24/2020, he reported noting blood in his semen approximately 1 month prior.  No pain with ejaculation.  He had noted some mild dysuria since that time.  He also noted some intermittency and hesitancy.  No gross hematuria. ?AUA score = 7. ?He reported difficulty with achieving erections x 1 month.  No prior history of erectile dysfunction. ?He was started on testosterone replacement therapy approximately 2 weeks prior to his visit.  No laboratory results available for review. ? ?PSA from 10/22: 2.2 ? ?His prostate exam was consistent with prostatitis.  He was treated with Levaquin x 21 days. ? ?At his visit in 12/22, he had completed the Levaquin.  He reported a decrease in the level of hematospermia.  He noted some slight blood streaking in his semen.  He was not having any pain with ejaculation.  His erections had improved as well.  He reported only occasional slight dysuria.  No gross hematuria. ?AUA score = 5. ? ?At his visit in 1/23, he noted some increased blood in his semen  again.  He also reported some dysuria.  He noted a bulge in the left inguinal area, concerning for a hernia.  No gross hematuria or flank pain.  No change in his urinary symptoms. ?AUA score = 6. ?Urinalysis showed >30 RBCs. ? ?He was evaluated with a CT abdomen and pelvis with and without contrast on 04/20/2021.  This study showed no evidence of renal or ureteral calculi, no renal masses, and no evidence of obstruction. ?He presents today for further evaluation with cystoscopy. ?He has continued to notice blood in his semen.  He also reported an episode of gross hematuria since his last visit.  He is not having any dysuria or flank pain. ? ?Portions of the above documentation were copied from a prior visit for review purposes only. ? ? ?Past Medical History:  ?Past Medical History:  ?Diagnosis Date  ? Hypercholesteremia   ? Hypertension   ? ? ?Past Surgical History:  ?Past Surgical History:  ?Procedure Laterality Date  ? COLONOSCOPY    ? COLONOSCOPY N/A 08/16/2017  ? Procedure: COLONOSCOPY;  Surgeon: Daneil Dolin, MD;  Location: [facility];  Service: Endoscopy;  Laterality: N/A;  10:30  ? ? ?Allergies:  ?No Known Allergies ? ?Family History:  ?Family History  ?Problem Relation Age of Onset  ? Colon cancer Brother   ? ? ?Social History:  ?Social History  ? ?Tobacco Use  ? Smoking status: Never  ? Smokeless tobacco: Never  ?Vaping Use  ? Vaping Use: Never  used  ?Substance Use Topics  ? Alcohol use: Never  ? Drug use: Never  ? ?ROS: ?Constitutional:  Negative for fever, chills, weight loss ?CV: Negative for chest pain, previous MI, hypertension ?Respiratory:  Negative for shortness of breath, wheezing, sleep apnea, frequent cough ?GI:  Negative for nausea, vomiting, bloody stool, GERD ? ?Physical exam: ?BP (!) 145/85   Pulse 65   Wt 202 lb (91.6 kg)   BMI 29.83 kg/m?  ?GENERAL APPEARANCE:  Well appearing, well developed, well nourished, NAD ?HEENT:  Atraumatic, normocephalic, oropharynx clear ?NECK:  Supple  without lymphadenopathy or thyromegaly ?ABDOMEN:  Soft, non-tender, no masses ?EXTREMITIES:  Moves all extremities well, without clubbing, cyanosis, or edema ?NEUROLOGIC:  Alert and oriented x 3, normal gait, CN II-XII grossly intact ?MENTAL STATUS:  appropriate ?BACK:  Non-tender to palpation, No CVAT ?SKIN:  Warm, dry, and intact ? ? ?Results: ?U/A: >30 RBC ? ? ?Procedure:  Flexible Cystourethroscopy ? ?Pre-operative Diagnosis: Microscopic hematuria ? ?Post-operative Diagnosis: Microscopic hematuria ? ?Anesthesia:  local with lidocaine jelly ? ?Surgical Narrative: ? ?After appropriate informed consent was obtained, the patient was prepped and draped in the usual sterile fashion in the supine position.  The patient was correctly identified and the proper procedure delineated prior to proceeding.  Sterile lidocaine gel was instilled in the urethra. ?The flexible cystoscope was introduced without difficulty. ? ?Findings: ? ?Anterior urethra: Normal ? ?Posterior urethra:  lateral lobe enlargement of prostate; mucosal changes noted ventrally extending from verumontanum to prostate ? ?Bladder: Normal ? ?Ureteral orifices: normal ? ?Additional findings: ? ?Saline bladder wash for cytology was performed.   ? ?The cystoscope was then removed.  The patient tolerated the procedure well. ? ? ?

## 2021-04-30 LAB — PSA: Prostate Specific Ag, Serum: 2 ng/mL (ref 0.0–4.0)

## 2021-04-30 LAB — CYTOLOGY, URINE

## 2021-05-05 DIAGNOSIS — C4441 Basal cell carcinoma of skin of scalp and neck: Secondary | ICD-10-CM | POA: Diagnosis not present

## 2021-05-17 DIAGNOSIS — Z1331 Encounter for screening for depression: Secondary | ICD-10-CM | POA: Diagnosis not present

## 2021-05-17 DIAGNOSIS — Z0001 Encounter for general adult medical examination with abnormal findings: Secondary | ICD-10-CM | POA: Diagnosis not present

## 2021-05-17 DIAGNOSIS — Z6828 Body mass index (BMI) 28.0-28.9, adult: Secondary | ICD-10-CM | POA: Diagnosis not present

## 2021-05-17 DIAGNOSIS — E039 Hypothyroidism, unspecified: Secondary | ICD-10-CM | POA: Diagnosis not present

## 2021-05-17 DIAGNOSIS — E559 Vitamin D deficiency, unspecified: Secondary | ICD-10-CM | POA: Diagnosis not present

## 2021-05-17 DIAGNOSIS — E7849 Other hyperlipidemia: Secondary | ICD-10-CM | POA: Diagnosis not present

## 2021-05-17 DIAGNOSIS — E291 Testicular hypofunction: Secondary | ICD-10-CM | POA: Diagnosis not present

## 2021-05-17 DIAGNOSIS — R7309 Other abnormal glucose: Secondary | ICD-10-CM | POA: Diagnosis not present

## 2021-05-17 DIAGNOSIS — I10 Essential (primary) hypertension: Secondary | ICD-10-CM | POA: Diagnosis not present

## 2021-05-17 DIAGNOSIS — N183 Chronic kidney disease, stage 3 unspecified: Secondary | ICD-10-CM | POA: Diagnosis not present

## 2021-05-17 DIAGNOSIS — E782 Mixed hyperlipidemia: Secondary | ICD-10-CM | POA: Diagnosis not present

## 2021-05-18 ENCOUNTER — Ambulatory Visit
Admission: RE | Admit: 2021-05-18 | Discharge: 2021-05-18 | Disposition: A | Discharge status: Home / Self Care | Payer: Medicare HMO | Source: Ambulatory Visit | Attending: Urology | Admitting: Urology

## 2021-05-18 DIAGNOSIS — Z9079 Acquired absence of other genital organ(s): Secondary | ICD-10-CM | POA: Diagnosis not present

## 2021-05-18 DIAGNOSIS — R361 Hematospermia: Secondary | ICD-10-CM | POA: Diagnosis not present

## 2021-05-18 DIAGNOSIS — N5089 Other specified disorders of the male genital organs: Secondary | ICD-10-CM | POA: Diagnosis not present

## 2021-05-18 DIAGNOSIS — N400 Enlarged prostate without lower urinary tract symptoms: Secondary | ICD-10-CM | POA: Diagnosis not present

## 2021-05-18 MED ORDER — GADOBENATE DIMEGLUMINE 529 MG/ML IV SOLN
18.0000 mL | Freq: Once | INTRAVENOUS | Status: AC | PRN
  Administered 2021-05-18: 18 mL via INTRAVENOUS

## 2021-05-25 ENCOUNTER — Other Ambulatory Visit: Payer: Self-pay | Admitting: Urology

## 2021-05-25 DIAGNOSIS — R361 Hematospermia: Secondary | ICD-10-CM

## 2021-05-25 MED ORDER — FINASTERIDE 5 MG PO TABS: 5.0000 mg | ORAL_TABLET | Freq: Every day | ORAL | 11 refills | Status: DC

## 2021-06-18 DIAGNOSIS — E291 Testicular hypofunction: Secondary | ICD-10-CM | POA: Diagnosis not present

## 2021-06-27 DIAGNOSIS — E782 Mixed hyperlipidemia: Secondary | ICD-10-CM | POA: Diagnosis not present

## 2021-06-27 DIAGNOSIS — N183 Chronic kidney disease, stage 3 unspecified: Secondary | ICD-10-CM | POA: Diagnosis not present

## 2021-06-27 DIAGNOSIS — I5030 Unspecified diastolic (congestive) heart failure: Secondary | ICD-10-CM | POA: Diagnosis not present

## 2021-06-27 DIAGNOSIS — I130 Hypertensive heart and chronic kidney disease with heart failure and stage 1 through stage 4 chronic kidney disease, or unspecified chronic kidney disease: Secondary | ICD-10-CM | POA: Diagnosis not present

## 2021-07-16 DIAGNOSIS — E039 Hypothyroidism, unspecified: Secondary | ICD-10-CM | POA: Diagnosis not present

## 2021-07-16 DIAGNOSIS — E291 Testicular hypofunction: Secondary | ICD-10-CM | POA: Diagnosis not present

## 2021-07-19 ENCOUNTER — Encounter: Payer: Self-pay | Admitting: Urology

## 2021-07-19 ENCOUNTER — Ambulatory Visit: Payer: Medicare HMO | Admitting: Urology

## 2021-07-19 VITALS — BP 170/82 | HR 62

## 2021-07-19 DIAGNOSIS — R361 Hematospermia: Secondary | ICD-10-CM

## 2021-07-19 DIAGNOSIS — R3129 Other microscopic hematuria: Secondary | ICD-10-CM

## 2021-07-19 LAB — URINALYSIS, ROUTINE W REFLEX MICROSCOPIC
Bilirubin, UA: NEGATIVE
Glucose, UA: NEGATIVE
Leukocytes,UA: NEGATIVE
Nitrite, UA: NEGATIVE
Specific Gravity, UA: >1.03 — ABNORMAL HIGH (ref 1.005–1.030)
Urobilinogen, Ur: 0.2 mg/dL (ref 0.2–1.0)
pH, UA: 5.5 (ref 5.0–7.5)

## 2021-07-19 LAB — MICROSCOPIC EXAMINATION
Renal Epithel, UA: NONE SEEN /hpf
WBC, UA: NONE SEEN /hpf (ref 0–5)

## 2021-07-19 NOTE — Progress Notes (Signed)
Assessment: 1. Microscopic hematuria   2. Hematospermia     Plan: Hematuria profile later this week Will call with results  Chief Complaint:  Chief Complaint  Patient presents with   Hematuria   History of Present Illness:  Ronald Dixon is a 76 y.o. year old male who is seen for further evaluation of hematospermia.  At his initial visit on 12/24/2020, he reported noting blood in his semen approximately 1 month prior.  No pain with ejaculation.  He had noted some mild dysuria since that time.  He also noted some intermittency and hesitancy.  No gross hematuria. AUA score = 7. He reported difficulty with achieving erections x 1 month.  No prior history of erectile dysfunction. He was started on testosterone replacement therapy approximately 2 weeks prior to his visit.  No laboratory results available for review.  PSA from 10/22: 2.2  His prostate exam was consistent with prostatitis.  He was treated with Levaquin x 21 days.  At his visit in 12/22, he had completed the Levaquin.  He reported a decrease in the level of hematospermia.  He noted some slight blood streaking in his semen.  He was not having any pain with ejaculation.  His erections had improved as well.  He reported only occasional slight dysuria.  No gross hematuria. AUA score = 5.  At his visit in 1/23, he noted some increased blood in his semen again.  He also reported some dysuria.  He noted a bulge in the left inguinal area, concerning for a hernia.  No gross hematuria or flank pain.  No change in his urinary symptoms. AUA score = 6. Urinalysis showed >30 RBCs.  He was evaluated with a CT abdomen and pelvis with and without contrast on 04/20/2021.  This study showed no evidence of renal or ureteral calculi, no renal masses, and no evidence of obstruction. He continued to notice blood in his semen.  He also reported an episode of gross hematuria.  He was not having any dysuria or flank pain. He was evaluated  with cystoscopy on 04/29/2021.  This showed some mucosal changes in the prostatic urethra, no bladder abnormalities. Urine cytology showed atypical cells. PSA from 3/23: 2.0 Prostate MRI from 05/18/2021 showed no radiographic evidence of high-grade prostate carcinoma, atrophy of the left seminal vesicle. He was started on finasteride 5 mg daily in 3/23.  He returns today for follow-up.  He continues to note some terminal gross hematuria.  This primarily occurs after strenuous activity.  He is on finasteride daily.  He continues to note hematospermia with his last episode approximately 2 weeks ago.  No pain with ejaculation. IPSS = 4 today.  Portions of the above documentation were copied from a prior visit for review purposes only.   Past Medical History:  Past Medical History:  Diagnosis Date   Hypercholesteremia    Hypertension     Past Surgical History:  Past Surgical History:  Procedure Laterality Date   COLONOSCOPY     COLONOSCOPY N/A 08/16/2017   Procedure: COLONOSCOPY;  Surgeon: Daneil Dolin, MD;  Location: [facility];  Service: Endoscopy;  Laterality: N/A;  10:30    Allergies:  No Known Allergies  Family History:  Family History  Problem Relation Age of Onset   Colon cancer Brother     Social History:  Social History   Tobacco Use   Smoking status: Never   Smokeless tobacco: Never  Vaping Use   Vaping Use: Never used  Substance Use Topics   Alcohol use: Never   Drug use: Never   ROS: Constitutional:  Negative for fever, chills, weight loss CV: Negative for chest pain, previous MI, hypertension Respiratory:  Negative for shortness of breath, wheezing, sleep apnea, frequent cough GI:  Negative for nausea, vomiting, bloody stool, GERD  Physical exam: BP (!) 170/82   Pulse 62  GENERAL APPEARANCE:  Well appearing, well developed, well nourished, NAD HEENT:  Atraumatic, normocephalic, oropharynx clear NECK:  Supple without lymphadenopathy or  thyromegaly ABDOMEN:  Soft, non-tender, no masses EXTREMITIES:  Moves all extremities well, without clubbing, cyanosis, or edema NEUROLOGIC:  Alert and oriented x 3, normal gait, CN II-XII grossly intact MENTAL STATUS:  appropriate BACK:  Non-tender to palpation, No CVAT SKIN:  Warm, dry, and intact   Results: U/A: 11-30 RBC

## 2021-07-20 ENCOUNTER — Other Ambulatory Visit: Payer: Medicare HMO

## 2021-07-20 DIAGNOSIS — R82998 Other abnormal findings in urine: Secondary | ICD-10-CM | POA: Diagnosis not present

## 2021-07-20 DIAGNOSIS — R3129 Other microscopic hematuria: Secondary | ICD-10-CM | POA: Diagnosis not present

## 2021-07-20 NOTE — Progress Notes (Signed)
Urine cytology sent today Tracking  [phone]   Pick up number  [id number]

## 2021-07-28 ENCOUNTER — Other Ambulatory Visit: Payer: Self-pay | Admitting: Urology

## 2021-08-06 ENCOUNTER — Telehealth: Payer: Self-pay

## 2021-08-06 NOTE — Telephone Encounter (Signed)
Patient is requesting results from his dianon results.  He received them on my chart but wants more explanation if he needs to come in for a visit.

## 2021-08-18 DIAGNOSIS — E291 Testicular hypofunction: Secondary | ICD-10-CM | POA: Diagnosis not present

## 2021-09-02 DIAGNOSIS — E039 Hypothyroidism, unspecified: Secondary | ICD-10-CM | POA: Diagnosis not present

## 2021-09-02 DIAGNOSIS — E291 Testicular hypofunction: Secondary | ICD-10-CM | POA: Diagnosis not present

## 2021-09-13 DIAGNOSIS — H43813 Vitreous degeneration, bilateral: Secondary | ICD-10-CM | POA: Diagnosis not present

## 2021-09-13 DIAGNOSIS — H40053 Ocular hypertension, bilateral: Secondary | ICD-10-CM | POA: Diagnosis not present

## 2021-09-13 DIAGNOSIS — H2513 Age-related nuclear cataract, bilateral: Secondary | ICD-10-CM | POA: Diagnosis not present

## 2021-09-16 DIAGNOSIS — E291 Testicular hypofunction: Secondary | ICD-10-CM | POA: Diagnosis not present

## 2021-10-04 DIAGNOSIS — C4442 Squamous cell carcinoma of skin of scalp and neck: Secondary | ICD-10-CM | POA: Diagnosis not present

## 2021-10-04 DIAGNOSIS — L570 Actinic keratosis: Secondary | ICD-10-CM | POA: Diagnosis not present

## 2021-10-04 DIAGNOSIS — L821 Other seborrheic keratosis: Secondary | ICD-10-CM | POA: Diagnosis not present

## 2021-10-04 DIAGNOSIS — Z85828 Personal history of other malignant neoplasm of skin: Secondary | ICD-10-CM | POA: Diagnosis not present

## 2021-10-04 DIAGNOSIS — L814 Other melanin hyperpigmentation: Secondary | ICD-10-CM | POA: Diagnosis not present

## 2021-10-19 DIAGNOSIS — E291 Testicular hypofunction: Secondary | ICD-10-CM | POA: Diagnosis not present

## 2021-11-16 DIAGNOSIS — C44329 Squamous cell carcinoma of skin of other parts of face: Secondary | ICD-10-CM | POA: Diagnosis not present

## 2021-11-23 DIAGNOSIS — E291 Testicular hypofunction: Secondary | ICD-10-CM | POA: Diagnosis not present

## 2021-12-22 DIAGNOSIS — E291 Testicular hypofunction: Secondary | ICD-10-CM | POA: Diagnosis not present

## 2021-12-22 DIAGNOSIS — Z23 Encounter for immunization: Secondary | ICD-10-CM | POA: Diagnosis not present

## 2022-01-25 DIAGNOSIS — E291 Testicular hypofunction: Secondary | ICD-10-CM | POA: Diagnosis not present

## 2022-01-26 DIAGNOSIS — D044 Carcinoma in situ of skin of scalp and neck: Secondary | ICD-10-CM | POA: Diagnosis not present

## 2022-01-26 DIAGNOSIS — L570 Actinic keratosis: Secondary | ICD-10-CM | POA: Diagnosis not present

## 2022-02-07 DIAGNOSIS — D044 Carcinoma in situ of skin of scalp and neck: Secondary | ICD-10-CM | POA: Diagnosis not present

## 2022-02-07 DIAGNOSIS — L570 Actinic keratosis: Secondary | ICD-10-CM | POA: Diagnosis not present

## 2022-03-03 DIAGNOSIS — E291 Testicular hypofunction: Secondary | ICD-10-CM | POA: Diagnosis not present

## 2022-03-21 DIAGNOSIS — H43813 Vitreous degeneration, bilateral: Secondary | ICD-10-CM | POA: Diagnosis not present

## 2022-03-21 DIAGNOSIS — H40053 Ocular hypertension, bilateral: Secondary | ICD-10-CM | POA: Diagnosis not present

## 2022-03-21 DIAGNOSIS — H2513 Age-related nuclear cataract, bilateral: Secondary | ICD-10-CM | POA: Diagnosis not present

## 2022-04-06 DIAGNOSIS — E291 Testicular hypofunction: Secondary | ICD-10-CM | POA: Diagnosis not present

## 2022-04-11 DIAGNOSIS — D0439 Carcinoma in situ of skin of other parts of face: Secondary | ICD-10-CM | POA: Diagnosis not present

## 2022-04-11 DIAGNOSIS — L570 Actinic keratosis: Secondary | ICD-10-CM | POA: Diagnosis not present

## 2022-04-11 DIAGNOSIS — L905 Scar conditions and fibrosis of skin: Secondary | ICD-10-CM | POA: Diagnosis not present

## 2022-04-11 DIAGNOSIS — L821 Other seborrheic keratosis: Secondary | ICD-10-CM | POA: Diagnosis not present

## 2022-05-04 DIAGNOSIS — E291 Testicular hypofunction: Secondary | ICD-10-CM | POA: Diagnosis not present

## 2022-05-11 ENCOUNTER — Other Ambulatory Visit: Payer: Self-pay | Admitting: Urology

## 2022-05-11 DIAGNOSIS — R361 Hematospermia: Secondary | ICD-10-CM

## 2022-05-23 DIAGNOSIS — E039 Hypothyroidism, unspecified: Secondary | ICD-10-CM | POA: Diagnosis not present

## 2022-05-23 DIAGNOSIS — I130 Hypertensive heart and chronic kidney disease with heart failure and stage 1 through stage 4 chronic kidney disease, or unspecified chronic kidney disease: Secondary | ICD-10-CM | POA: Diagnosis not present

## 2022-05-23 DIAGNOSIS — E663 Overweight: Secondary | ICD-10-CM | POA: Diagnosis not present

## 2022-05-23 DIAGNOSIS — I10 Essential (primary) hypertension: Secondary | ICD-10-CM | POA: Diagnosis not present

## 2022-05-23 DIAGNOSIS — R7309 Other abnormal glucose: Secondary | ICD-10-CM | POA: Diagnosis not present

## 2022-05-23 DIAGNOSIS — Z0001 Encounter for general adult medical examination with abnormal findings: Secondary | ICD-10-CM | POA: Diagnosis not present

## 2022-05-23 DIAGNOSIS — Z6828 Body mass index (BMI) 28.0-28.9, adult: Secondary | ICD-10-CM | POA: Diagnosis not present

## 2022-05-23 DIAGNOSIS — Z1331 Encounter for screening for depression: Secondary | ICD-10-CM | POA: Diagnosis not present

## 2022-05-23 DIAGNOSIS — Z125 Encounter for screening for malignant neoplasm of prostate: Secondary | ICD-10-CM | POA: Diagnosis not present

## 2022-05-23 DIAGNOSIS — G63 Polyneuropathy in diseases classified elsewhere: Secondary | ICD-10-CM | POA: Diagnosis not present

## 2022-05-23 DIAGNOSIS — N183 Chronic kidney disease, stage 3 unspecified: Secondary | ICD-10-CM | POA: Diagnosis not present

## 2022-05-23 DIAGNOSIS — I5030 Unspecified diastolic (congestive) heart failure: Secondary | ICD-10-CM | POA: Diagnosis not present

## 2022-05-23 DIAGNOSIS — E782 Mixed hyperlipidemia: Secondary | ICD-10-CM | POA: Diagnosis not present

## 2022-05-23 DIAGNOSIS — E559 Vitamin D deficiency, unspecified: Secondary | ICD-10-CM | POA: Diagnosis not present

## 2022-06-03 DIAGNOSIS — E291 Testicular hypofunction: Secondary | ICD-10-CM | POA: Diagnosis not present

## 2022-07-12 ENCOUNTER — Encounter: Payer: Self-pay | Admitting: *Deleted

## 2022-07-12 DIAGNOSIS — L218 Other seborrheic dermatitis: Secondary | ICD-10-CM | POA: Diagnosis not present

## 2022-07-12 DIAGNOSIS — L821 Other seborrheic keratosis: Secondary | ICD-10-CM | POA: Diagnosis not present

## 2022-07-12 DIAGNOSIS — L814 Other melanin hyperpigmentation: Secondary | ICD-10-CM | POA: Diagnosis not present

## 2022-07-12 DIAGNOSIS — Z85828 Personal history of other malignant neoplasm of skin: Secondary | ICD-10-CM | POA: Diagnosis not present

## 2022-07-12 DIAGNOSIS — L570 Actinic keratosis: Secondary | ICD-10-CM | POA: Diagnosis not present

## 2022-08-03 DIAGNOSIS — E291 Testicular hypofunction: Secondary | ICD-10-CM | POA: Diagnosis not present

## 2022-09-23 DIAGNOSIS — H43813 Vitreous degeneration, bilateral: Secondary | ICD-10-CM | POA: Diagnosis not present

## 2022-09-23 DIAGNOSIS — H40053 Ocular hypertension, bilateral: Secondary | ICD-10-CM | POA: Diagnosis not present

## 2022-09-23 DIAGNOSIS — H2513 Age-related nuclear cataract, bilateral: Secondary | ICD-10-CM | POA: Diagnosis not present

## 2022-09-30 DIAGNOSIS — E291 Testicular hypofunction: Secondary | ICD-10-CM | POA: Diagnosis not present

## 2022-11-29 DIAGNOSIS — Z23 Encounter for immunization: Secondary | ICD-10-CM | POA: Diagnosis not present

## 2022-11-29 DIAGNOSIS — E291 Testicular hypofunction: Secondary | ICD-10-CM | POA: Diagnosis not present

## 2022-12-12 DIAGNOSIS — L570 Actinic keratosis: Secondary | ICD-10-CM | POA: Diagnosis not present

## 2022-12-12 DIAGNOSIS — L738 Other specified follicular disorders: Secondary | ICD-10-CM | POA: Diagnosis not present

## 2022-12-12 DIAGNOSIS — L821 Other seborrheic keratosis: Secondary | ICD-10-CM | POA: Diagnosis not present

## 2022-12-12 DIAGNOSIS — L814 Other melanin hyperpigmentation: Secondary | ICD-10-CM | POA: Diagnosis not present

## 2022-12-12 DIAGNOSIS — Z85828 Personal history of other malignant neoplasm of skin: Secondary | ICD-10-CM | POA: Diagnosis not present

## 2022-12-12 DIAGNOSIS — L820 Inflamed seborrheic keratosis: Secondary | ICD-10-CM | POA: Diagnosis not present

## 2022-12-20 DIAGNOSIS — L270 Generalized skin eruption due to drugs and medicaments taken internally: Secondary | ICD-10-CM | POA: Diagnosis not present

## 2022-12-20 DIAGNOSIS — E663 Overweight: Secondary | ICD-10-CM | POA: Diagnosis not present

## 2022-12-20 DIAGNOSIS — Z6828 Body mass index (BMI) 28.0-28.9, adult: Secondary | ICD-10-CM | POA: Diagnosis not present

## 2023-01-02 IMAGING — MR MR PROSTATE WO/W CM
12 series · 48 of 48 positions shown · IV contrast (Multihance 18ml)
Comparison: None.

CLINICAL DATA: Enlarged prostate.  Hematospermia.

EXAM:
MR PROSTATE WITHOUT AND WITH CONTRAST
TECHNIQUE: Multiplanar multisequence MRI images were obtained of the pelvis
centered about the prostate. Pre and post contrast images were
obtained.
CONTRAST:  18mL MULTIHANCE GADOBENATE DIMEGLUMINE 529 MG/ML IV SOLN

[Series 3: T2 · coronal · 3.0mm · 0.70mm/px · 1 of 30 slices shown (1 of 3)]
[im 1/30]
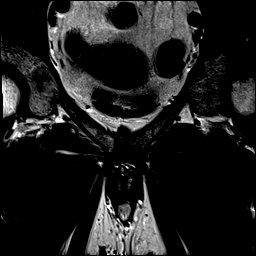

[Series 4: T1 · axial · 5.0mm · 1.25mm/px · z∈[-149,+86]mm · 2 of 96 slices shown]
[im 1/96]
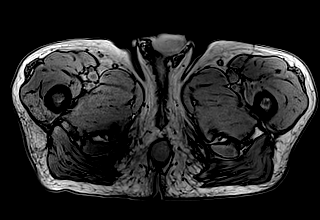
[im 96/96]
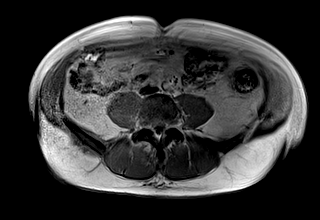

[Series 5: DWI · axial · 3.0mm · 1.75mm/px · z∈[-137,-53]mm · 2 of 87 slices shown (1 of 3)]
[im 1/87]
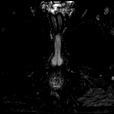
[im 87/87]
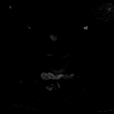

[Series 6: DWI · axial · 3.0mm · 1.75mm/px · 1 of 29 slices shown (2 of 3)]
[im 1/29]
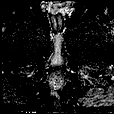

[Series 7: DWI · axial · 3.0mm · 1.75mm/px · 1 of 29 slices shown (3 of 3)]
[im 1/29]
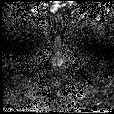

[Series 8: T2 · axial · 3.0mm · 0.56mm/px · 1 of 26 slices shown (2 of 3)]
[im 1/26]
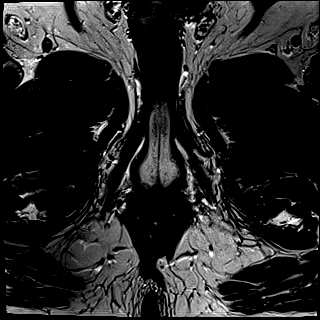

[Series 9: T2 · axial · 1.0mm · 1.04mm/px · z∈[-131,-60]mm · 2 of 72 slices shown (3 of 3)]
[im 1/72]
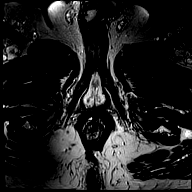
[im 72/72]
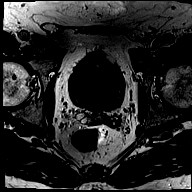

[Series 10: pre t1_twist_tra_dyn · axial · non-contrast · 3.5mm · 0.83mm/px · 1 of 20 slices shown]
[im 1/20]
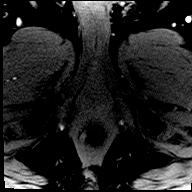

[Series 11: post t1_twist_tra_dyn-copy center · axial · non-contrast · 3.5mm · 0.83mm/px · z∈[-129,-63]mm · 17 of 600 slices shown]
[im 1/600]
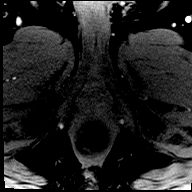
[im 38/600]
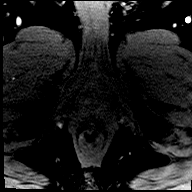
[im 75/600]
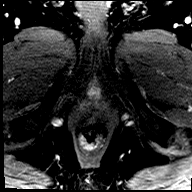
[im 113/600]
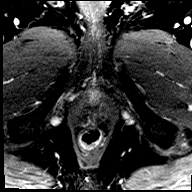
[im 150/600]
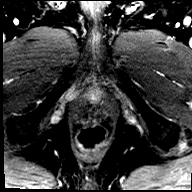
[im 188/600]
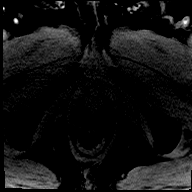
[im 225/600]
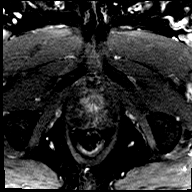
[im 263/600]
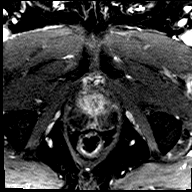
[im 300/600]
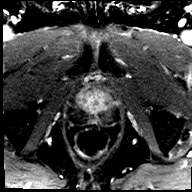
[im 337/600]
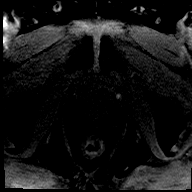
[im 375/600]
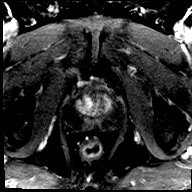
[im 412/600]
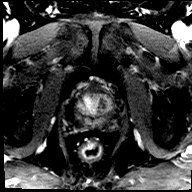
[im 450/600]
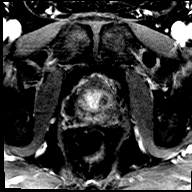
[im 487/600]
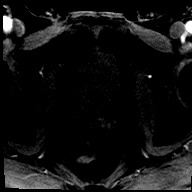
[im 525/600]
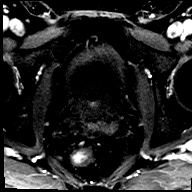
[im 562/600]
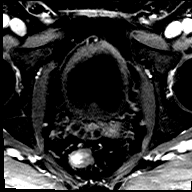
[im 600/600]
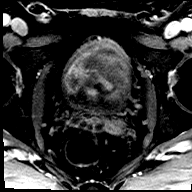

[Series 12: post t1_twist_tra_dyn-copy cent_sub · axial · 3.5mm · 0.83mm/px · z∈[-129,-63]mm · 16 of 580 slices shown]
[im 1/580]
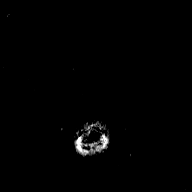
[im 39/580]
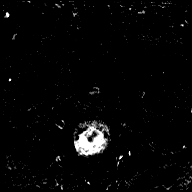
[im 78/580]
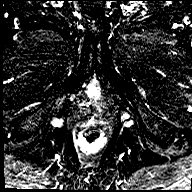
[im 116/580]
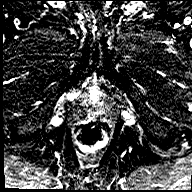
[im 155/580]
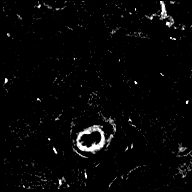
[im 194/580]
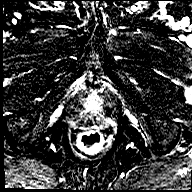
[im 232/580]
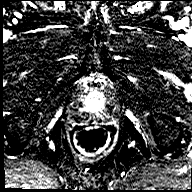
[im 271/580]
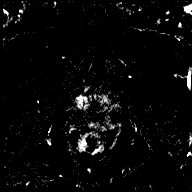
[im 309/580]
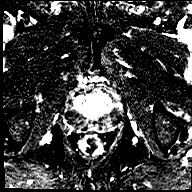
[im 348/580]
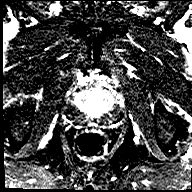
[im 387/580]
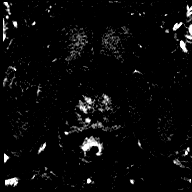
[im 425/580]
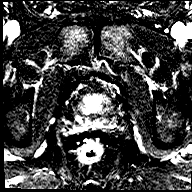
[im 464/580]
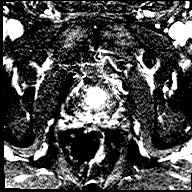
[im 502/580]
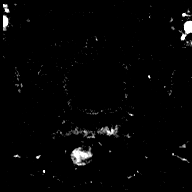
[im 541/580]
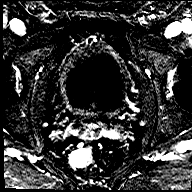
[im 580/580]
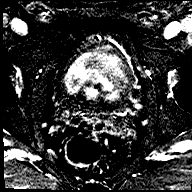

[Series 13: t1_vibe_dixon_tra_f · axial · 2.5mm · 0.91mm/px · z∈[-130,+67]mm · 2 of 80 slices shown]
[im 1/80]
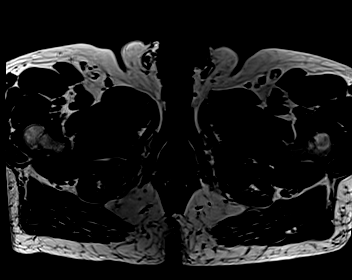
[im 80/80]
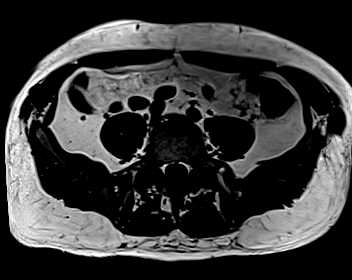

[Series 14: t1_vibe_dixon_tra_w · axial · 2.5mm · 0.91mm/px · z∈[-130,+67]mm · 2 of 80 slices shown]
[im 1/80]
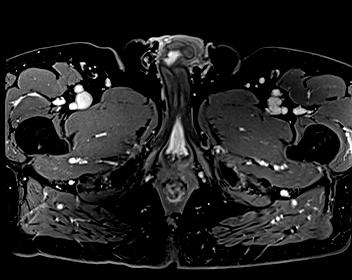
[im 80/80]
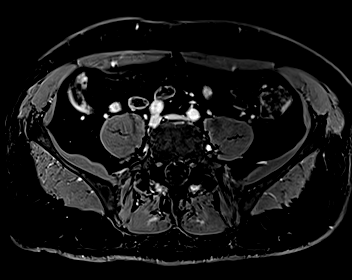

[48 of 48 positions shown; findings below may reference images not displayed]

FINDINGS: Prostate:

-- Peripheral Zone: No focal lesion seen on ADC or high b-value DWI
sequences.

-- Transition/Central Zone: Mildly enlarged with multiple small BPH
nodules. No nodules with suspicious characteristics on T2-weighted
or diffusion imaging.

-- Measurements/Volume:  4.9 by 3.7 by 5.2 cm (volume = 49 cm^3)

Transcapsular spread:  Absent

Seminal vesicle involvement: Absent. Asymmetric atrophy of the left
seminal vesicle noted. No mass identified.

Neurovascular bundle involvement:  Absent

Pelvic adenopathy: None visualized

Bone metastasis: None visualized

Other:  None
IMPRESSION: No radiographic evidence of high-grade prostate carcinoma. PI-RADS 1
(v2.1): Very Low (clinically significant cancer highly unlikely)

Atrophy of the left seminal vesicle incidentally noted.

## 2023-01-09 DIAGNOSIS — E291 Testicular hypofunction: Secondary | ICD-10-CM | POA: Diagnosis not present

## 2023-02-09 DIAGNOSIS — E291 Testicular hypofunction: Secondary | ICD-10-CM | POA: Diagnosis not present

## 2023-03-14 DIAGNOSIS — E291 Testicular hypofunction: Secondary | ICD-10-CM | POA: Diagnosis not present

## 2023-03-24 DIAGNOSIS — H40053 Ocular hypertension, bilateral: Secondary | ICD-10-CM | POA: Diagnosis not present

## 2023-03-24 DIAGNOSIS — H43813 Vitreous degeneration, bilateral: Secondary | ICD-10-CM | POA: Diagnosis not present

## 2023-03-24 DIAGNOSIS — H2513 Age-related nuclear cataract, bilateral: Secondary | ICD-10-CM | POA: Diagnosis not present

## 2023-03-30 DIAGNOSIS — E291 Testicular hypofunction: Secondary | ICD-10-CM | POA: Diagnosis not present

## 2023-03-30 DIAGNOSIS — F5104 Psychophysiologic insomnia: Secondary | ICD-10-CM | POA: Diagnosis not present

## 2023-03-30 DIAGNOSIS — E039 Hypothyroidism, unspecified: Secondary | ICD-10-CM | POA: Diagnosis not present

## 2023-03-30 DIAGNOSIS — Z6829 Body mass index (BMI) 29.0-29.9, adult: Secondary | ICD-10-CM | POA: Diagnosis not present

## 2023-03-30 DIAGNOSIS — E782 Mixed hyperlipidemia: Secondary | ICD-10-CM | POA: Diagnosis not present

## 2023-03-30 DIAGNOSIS — E559 Vitamin D deficiency, unspecified: Secondary | ICD-10-CM | POA: Diagnosis not present

## 2023-03-30 DIAGNOSIS — I10 Essential (primary) hypertension: Secondary | ICD-10-CM | POA: Diagnosis not present

## 2023-03-30 DIAGNOSIS — I130 Hypertensive heart and chronic kidney disease with heart failure and stage 1 through stage 4 chronic kidney disease, or unspecified chronic kidney disease: Secondary | ICD-10-CM | POA: Diagnosis not present

## 2023-03-30 DIAGNOSIS — Z0001 Encounter for general adult medical examination with abnormal findings: Secondary | ICD-10-CM | POA: Diagnosis not present

## 2023-03-30 DIAGNOSIS — Z1331 Encounter for screening for depression: Secondary | ICD-10-CM | POA: Diagnosis not present

## 2023-03-30 DIAGNOSIS — N183 Chronic kidney disease, stage 3 unspecified: Secondary | ICD-10-CM | POA: Diagnosis not present

## 2023-03-30 DIAGNOSIS — Z125 Encounter for screening for malignant neoplasm of prostate: Secondary | ICD-10-CM | POA: Diagnosis not present

## 2023-03-30 DIAGNOSIS — E663 Overweight: Secondary | ICD-10-CM | POA: Diagnosis not present

## 2023-04-05 NOTE — Progress Notes (Signed)
 GI Office Note    Referring Provider: Marvine Rush, MD Primary Care Physician:  Marvine Rush, MD  Primary Gastroenterologist: Lamar HERO.Rourk, MD  Chief Complaint   Chief Complaint  Patient presents with   Colonoscopy    Colonoscopy screening    History of Present Illness   Ronald Dixon is a 78 y.o. male presenting today at the request of Marvine Rush, MD for OV prior to scheduling colonoscopy.   Per review of records, family history of colon cancer in his brother at age 62.  Colonoscopy June 2019: (TriLyte split prep) -Normal colonoscopy -Repeat colonoscopy in 5 years for screening if overall health permits.  Today:  Has never had polyps on any prior colonoscopies.  He denies any melena, BRBPR, change in bowel habits, constipation, diarrhea, abdominal pain, nausea, vomiting, dysphagia, reflux, odynophagia, lack of appetite, early satiety, or unintentional weight loss.  The last colonscopy the problem he had was the prep. Felt like he was going to fall out after he sat down to eat and had to go home and lay down.   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     Cholecalciferol (VITAMIN D3) 1000 units CAPS Take 1,000 Units by mouth daily.      dorzolamide-timolol (COSOPT) 2-0.5 % ophthalmic solution      finasteride  (PROSCAR ) 5 MG tablet Take 1 tablet (5 mg total) by mouth daily. NEEDS APPT FOR REFILLS 30 tablet 0   Gluc-Chonn-MSM-Boswellia-Vit D (GLUCOSAMINE CHOND TRIPLE/VIT D) TABS Take 1 tablet by mouth daily.      latanoprost (XALATAN) 0.005 % ophthalmic solution      levothyroxine (SYNTHROID) 50 MCG tablet Take 25 mcg by mouth daily.     olmesartan-hydrochlorothiazide (BENICAR HCT) 40-25 MG tablet       polyethylene glycol-electrolytes (TRILYTE) 420 g solution Take 4,000 mLs by mouth as directed. 4000 mL 0   simvastatin (ZOCOR) 40 MG tablet Take 40 mg by mouth daily.   0   naproxen sodium (ALEVE) 220 MG tablet Take 220 mg by mouth daily as needed (for pain or headache).  (Patient not taking: Reported on 04/06/2023)     No current facility-administered medications for this visit.    Past Medical History:  Diagnosis Date   Hypercholesteremia    Hypertension     Past Surgical History:  Procedure Laterality Date   COLONOSCOPY     COLONOSCOPY N/A 08/16/2017   Procedure: COLONOSCOPY;  Surgeon: Shaaron Lamar HERO, MD;  Location: [facility];  Service: Endoscopy;  Laterality: N/A;  10:30    Family History  Problem Relation Age of Onset   Colon cancer Brother     Allergies as of 04/06/2023   (No Known Allergies)    Social History   Socioeconomic History   Marital status: Married    Spouse name: Not on file   Number of children: Not on file   Years of education: Not on file   Highest education level: Not on file  Occupational History   Not on file  Tobacco Use   Smoking status: Never   Smokeless tobacco: Never  Vaping Use   Vaping status: Never Used  Substance and Sexual Activity  Alcohol use: Never   Drug use: Never   Sexual activity: Not on file  Other Topics Concern   Not on file  Social History Narrative   Not on file   Social Drivers of Health   Financial Resource Strain: Not on file  Food Insecurity: Not on file  Transportation Needs: Not on file  Physical Activity: Not on file  Stress: Not on file  Social Connections: Not on file  Intimate Partner Violence: Not on file     Review of Systems   Gen: Denies any fever, chills, fatigue, weight loss, lack of appetite.  CV: Denies chest pain, heart palpitations, peripheral edema, syncope.  Resp: Denies shortness of breath at rest or with exertion. Denies wheezing or cough.  GI: see HPI GU : Denies urinary  burning, urinary frequency, urinary hesitancy MS: Denies joint pain, muscle weakness, cramps, or limitation of movement.  Derm: Denies rash, itching, dry skin Psych: Denies depression, anxiety, memory loss, and confusion Heme: Denies bruising, bleeding, and enlarged lymph nodes.  Physical Exam   BP 139/82 (BP Location: Left Arm, Patient Position: Sitting, Cuff Size: Large)   Pulse 67   Temp 97.8 F (36.6 C) (Temporal)   Ht 5' 8 (1.727 m)   Wt 199 lb 12.8 oz (90.6 kg)   BMI 30.38 kg/m   General:   Alert and oriented. Pleasant and cooperative. Well-nourished and well-developed.  Head:  Normocephalic and atraumatic. Eyes:  Without icterus, sclera clear and conjunctiva pink.  Ears:  Normal auditory acuity. Mouth:  No deformity or lesions, oral mucosa pink.  Lungs:  Clear to auscultation bilaterally. No wheezes, rales, or rhonchi. No distress.  Heart:  S1, S2 present without murmurs appreciated.  Rectal:  Deferred  Msk:  Symmetrical without gross deformities. Normal posture. Neurologic:  Alert and  oriented x4;  grossly normal neurologically. Skin:  Intact without significant lesions or rashes. Psych:  Alert and cooperative. Normal mood and affect.  Assessment/PLAN   Ronald Dixon is a 78 y.o. male with a history of HTN and HLD presenting today for office visit to discuss scheduling colonoscopy.  Screening for colon cancer, family history of colon cancer: Family history of colon cancer his brother, diagnosed at age 92.  Last colonoscopy in 2019 with no evidence of polyps.  He did report some issues with possible syncope/dizziness after his last colonoscopy however tolerated the prep just fine.  He reports no colon polyps on any of his prior colonoscopies.  He states that although he is in pretty good health and only taking medication for high blood pressure and hyperlipidemia that he would prefer to not undergo a colonoscopy given he is not having any concerning symptoms.  We also  discussed that Cologuard would not be a great option for him given if it were positive we would then for sure need to do a colonoscopy.  We discussed that without doing a colonoscopy we cannot be sure that there has not been any growth of polyps since his last colonoscopy given his family history however it is reassuring that he does not have any concerning alarm symptoms at this time.  We did discuss that if he changes his mind within the next 6 months and has not had any significant change in his medical history that we could schedule him a colonoscopy.   Charmaine Melia, MSN, FNP-BC, AGACNP-BC [facility]

## 2023-04-06 ENCOUNTER — Ambulatory Visit (INDEPENDENT_AMBULATORY_CARE_PROVIDER_SITE_OTHER): Payer: No Typology Code available for payment source | Admitting: Gastroenterology

## 2023-04-06 ENCOUNTER — Encounter: Payer: Self-pay | Admitting: Gastroenterology

## 2023-04-06 VITALS — BP 139/82 | HR 67 | Temp 97.8°F | Ht 68.0 in | Wt 199.8 lb

## 2023-04-06 DIAGNOSIS — Z800 Family history of malignant neoplasm of digestive organs: Secondary | ICD-10-CM | POA: Diagnosis not present

## 2023-04-06 DIAGNOSIS — I10 Essential (primary) hypertension: Secondary | ICD-10-CM

## 2023-04-06 DIAGNOSIS — Z1211 Encounter for screening for malignant neoplasm of colon: Secondary | ICD-10-CM

## 2023-04-06 DIAGNOSIS — Z83719 Family history of colon polyps, unspecified: Secondary | ICD-10-CM

## 2023-04-06 DIAGNOSIS — E7849 Other hyperlipidemia: Secondary | ICD-10-CM | POA: Diagnosis not present

## 2023-04-06 NOTE — Patient Instructions (Addendum)
 If you have any issues between now and 6 months and decided you want a colonoscopy give us  a call.   It was a pleasure to see you today. I want to create trusting relationships with patients. If you receive a survey regarding your visit,  I greatly appreciate you taking time to fill this out on paper or through your MyChart. I value your feedback.  Charmaine Melia, MSN, FNP-BC, AGACNP-BC [facility]

## 2023-04-07 ENCOUNTER — Encounter: Payer: Self-pay | Admitting: Gastroenterology

## 2023-04-13 DIAGNOSIS — E291 Testicular hypofunction: Secondary | ICD-10-CM | POA: Diagnosis not present

## 2023-04-17 DIAGNOSIS — D0422 Carcinoma in situ of skin of left ear and external auricular canal: Secondary | ICD-10-CM | POA: Diagnosis not present

## 2023-04-17 DIAGNOSIS — L821 Other seborrheic keratosis: Secondary | ICD-10-CM | POA: Diagnosis not present

## 2023-04-17 DIAGNOSIS — L570 Actinic keratosis: Secondary | ICD-10-CM | POA: Diagnosis not present

## 2023-04-17 DIAGNOSIS — C44612 Basal cell carcinoma of skin of right upper limb, including shoulder: Secondary | ICD-10-CM | POA: Diagnosis not present

## 2023-04-17 DIAGNOSIS — L218 Other seborrheic dermatitis: Secondary | ICD-10-CM | POA: Diagnosis not present

## 2023-04-17 DIAGNOSIS — D485 Neoplasm of uncertain behavior of skin: Secondary | ICD-10-CM | POA: Diagnosis not present

## 2023-04-17 DIAGNOSIS — L814 Other melanin hyperpigmentation: Secondary | ICD-10-CM | POA: Diagnosis not present

## 2023-04-17 DIAGNOSIS — Z85828 Personal history of other malignant neoplasm of skin: Secondary | ICD-10-CM | POA: Diagnosis not present

## 2023-04-17 DIAGNOSIS — L853 Xerosis cutis: Secondary | ICD-10-CM | POA: Diagnosis not present

## 2023-05-16 DIAGNOSIS — E291 Testicular hypofunction: Secondary | ICD-10-CM | POA: Diagnosis not present

## 2023-05-31 DIAGNOSIS — C44229 Squamous cell carcinoma of skin of left ear and external auricular canal: Secondary | ICD-10-CM | POA: Diagnosis not present

## 2023-05-31 DIAGNOSIS — Z85828 Personal history of other malignant neoplasm of skin: Secondary | ICD-10-CM | POA: Diagnosis not present

## 2023-06-28 DIAGNOSIS — L570 Actinic keratosis: Secondary | ICD-10-CM | POA: Diagnosis not present

## 2023-06-28 DIAGNOSIS — Z85828 Personal history of other malignant neoplasm of skin: Secondary | ICD-10-CM | POA: Diagnosis not present

## 2023-06-28 DIAGNOSIS — D225 Melanocytic nevi of trunk: Secondary | ICD-10-CM | POA: Diagnosis not present

## 2023-06-28 DIAGNOSIS — C44622 Squamous cell carcinoma of skin of right upper limb, including shoulder: Secondary | ICD-10-CM | POA: Diagnosis not present

## 2023-06-28 DIAGNOSIS — L821 Other seborrheic keratosis: Secondary | ICD-10-CM | POA: Diagnosis not present

## 2023-06-28 DIAGNOSIS — C44612 Basal cell carcinoma of skin of right upper limb, including shoulder: Secondary | ICD-10-CM | POA: Diagnosis not present

## 2023-06-28 DIAGNOSIS — C44629 Squamous cell carcinoma of skin of left upper limb, including shoulder: Secondary | ICD-10-CM | POA: Diagnosis not present

## 2023-06-28 DIAGNOSIS — D485 Neoplasm of uncertain behavior of skin: Secondary | ICD-10-CM | POA: Diagnosis not present

## 2023-06-28 DIAGNOSIS — L814 Other melanin hyperpigmentation: Secondary | ICD-10-CM | POA: Diagnosis not present

## 2023-06-29 DIAGNOSIS — E291 Testicular hypofunction: Secondary | ICD-10-CM | POA: Diagnosis not present

## 2023-08-09 DIAGNOSIS — E291 Testicular hypofunction: Secondary | ICD-10-CM | POA: Diagnosis not present

## 2023-09-06 DIAGNOSIS — E291 Testicular hypofunction: Secondary | ICD-10-CM | POA: Diagnosis not present

## 2023-10-02 DIAGNOSIS — L578 Other skin changes due to chronic exposure to nonionizing radiation: Secondary | ICD-10-CM | POA: Diagnosis not present

## 2023-10-02 DIAGNOSIS — L570 Actinic keratosis: Secondary | ICD-10-CM | POA: Diagnosis not present

## 2023-10-02 DIAGNOSIS — Z85828 Personal history of other malignant neoplasm of skin: Secondary | ICD-10-CM | POA: Diagnosis not present

## 2023-10-16 DIAGNOSIS — E291 Testicular hypofunction: Secondary | ICD-10-CM | POA: Diagnosis not present

## 2023-11-02 ENCOUNTER — Ambulatory Visit
Admission: RE | Admit: 2023-11-02 | Discharge: 2023-11-02 | Disposition: A | Discharge status: Home / Self Care | Source: Ambulatory Visit | Attending: Nurse Practitioner | Admitting: Nurse Practitioner

## 2023-11-02 VITALS — BP 156/80 | HR 67 | Temp 98.2°F | Resp 20

## 2023-11-02 DIAGNOSIS — R0989 Other specified symptoms and signs involving the circulatory and respiratory systems: Secondary | ICD-10-CM | POA: Diagnosis not present

## 2023-11-02 DIAGNOSIS — R059 Cough, unspecified: Secondary | ICD-10-CM | POA: Diagnosis not present

## 2023-11-02 LAB — POC SOFIA SARS ANTIGEN FIA: SARS Coronavirus 2 Ag: NEGATIVE

## 2023-11-02 MED ORDER — AZITHROMYCIN 250 MG PO TABS: 250.0000 mg | ORAL_TABLET | Freq: Every day | ORAL | 0 refills | Status: DC

## 2023-11-02 MED ORDER — PROMETHAZINE-DM 6.25-15 MG/5ML PO SYRP: 5.0000 mL | ORAL_SOLUTION | Freq: Four times a day (QID) | ORAL | 0 refills | Status: DC | PRN

## 2023-11-02 MED ORDER — FLUTICASONE PROPIONATE 50 MCG/ACT NA SUSP: 2.0000 | Freq: Every day | NASAL | 0 refills | Status: DC

## 2023-11-02 MED ORDER — CETIRIZINE-PSEUDOEPHEDRINE ER 5-120 MG PO TB12: 1.0000 | ORAL_TABLET | Freq: Every day | ORAL | 0 refills | Status: DC

## 2023-11-02 NOTE — Discharge Instructions (Addendum)
 The COVID test was negative. Take medication as prescribed.  As discussed, if your symptoms fail to improve over the next 5 to 7 days, begin the antibiotic prescribed. Increase fluids and allow for plenty of rest. Recommend normal saline nasal spray throughout the day for nasal congestion and runny nose. For your cough, it may be helpful to use a humidifier in your bedroom at nighttime during sleep and to sleep elevated on pillows while symptoms persist. If symptoms fail to improve with this treatment, recommend follow-up with your primary care physician for further evaluation. Follow-up as needed.

## 2023-11-02 NOTE — ED Provider Notes (Signed)
 [facility]    CSN: [id number] Arrival date & time: 11/02/23  1541      History   Chief Complaint Chief Complaint  Patient presents with   Cough    Sinus infection with bad cough - Entered by patient    HPI Ronald Dixon is a 78 y.o. male.   The history is provided by the patient.   Patient presents with a several day history of cough, sinus congestion, and sinus drainage.  Patient denies fever, chills, headache, ear pain, ear drainage, wheezing, difficulty breathing, chest pain, abdominal pain, nausea, vomiting, diarrhea, or rash.  Patient reports that over the past 2 days, he has had worsening postnasal drainage, worsens with lying down.  States that his wife has been sick with the same or similar symptoms.  So far, he has been taking Mucinex for his symptoms.  Patient is requesting a COVID test.  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 Authorizing Provider  aspirin EC 81 MG tablet Take 81 mg by mouth daily.    [provider]  Cholecalciferol (VITAMIN D3) 1000 units CAPS Take 1,000 Units by mouth daily.     [provider]  dorzolamide-timolol (COSOPT) 2-0.5 % ophthalmic solution  02/09/23   [provider]  finasteride  (PROSCAR ) 5 MG tablet Take 1 tablet (5 mg total) by mouth daily. NEEDS APPT FOR REFILLS 05/11/22   Stoneking, Adine PARAS., MD  Gluc-Chonn-MSM-Boswellia-Vit D (GLUCOSAMINE  CHOND TRIPLE/VIT D) TABS Take 1 tablet by mouth daily.     [provider]  latanoprost (XALATAN) 0.005 % ophthalmic solution  02/09/23   [provider]  levothyroxine (SYNTHROID) 50 MCG tablet Take 25 mcg by mouth daily. 12/10/20   [provider]  naproxen sodium (ALEVE) 220 MG tablet Take 220 mg by mouth daily as needed (for pain or headache).  Patient not taking: Reported on 04/06/2023    [provider]  olmesartan-hydrochlorothiazide (BENICAR HCT) 40-25 MG tablet  12/23/22   [provider]  polyethylene glycol-electrolytes (TRILYTE) 420 g solution Take 4,000 mLs by mouth as directed. 05/24/17   Marvis Camellia LABOR, NP  simvastatin (ZOCOR) 40 MG tablet Take 40 mg by mouth daily.  03/04/17   [provider]    Family History Family History  Problem Relation Age of Onset   Colon cancer Brother     Social History Social History   Tobacco Use   Smoking status: Never   Smokeless tobacco: Never  Vaping Use   Vaping status: Never Used  Substance Use Topics   Alcohol use: Never   Drug use: Never     Allergies   Patient has no known allergies.   Review of Systems Review of Systems Per HPI  Physical Exam Triage Vital Signs ED Triage Vitals  Encounter Vitals Group     BP 11/02/23 1606 (!) 156/80  Girls Systolic BP Percentile --      Girls Diastolic BP Percentile --      Boys Systolic BP Percentile --      Boys Diastolic BP Percentile --      Pulse Rate 11/02/23 1606 67     Resp 11/02/23 1606 20     Temp 11/02/23 1606 98.2 F (36.8 C)     Temp src --      SpO2 11/02/23 1606 92 %     Weight --      Height --      Head Circumference --      Peak Flow --      Pain Score 11/02/23 1607 0     Pain Loc --      Pain Education --      Exclude from Growth Chart --    No data found.  Updated Vital Signs BP (!) 156/80 (BP Location: Right Arm)   Pulse 67   Temp 98.2 F (36.8 C)   Resp 20   SpO2 92%   Visual  Acuity Right Eye Distance:   Left Eye Distance:   Bilateral Distance:    Right Eye Near:   Left Eye Near:    Bilateral Near:     Physical Exam Vitals and nursing note reviewed.  Constitutional:      General: He is not in acute distress.    Appearance: Normal appearance.  HENT:     Head: Normocephalic.     Right Ear: Tympanic membrane, ear canal and external ear normal.     Left Ear: Tympanic membrane, ear canal and external ear normal.     Nose: Rhinorrhea present.     Mouth/Throat:     Lips: Pink.     Mouth: Mucous membranes are moist.     Pharynx: Uvula midline. Postnasal drip present. No pharyngeal swelling, oropharyngeal exudate, posterior oropharyngeal erythema or uvula swelling.     Comments: Cobblestoning present to posterior oropharynx  Eyes:     Extraocular Movements: Extraocular movements intact.     Conjunctiva/sclera: Conjunctivae normal.     Pupils: Pupils are equal, round, and reactive to light.  Cardiovascular:     Rate and Rhythm: Normal rate and regular rhythm.     Pulses: Normal pulses.     Heart sounds: Normal heart sounds.  Pulmonary:     Effort: Pulmonary effort is normal. No respiratory distress.     Breath sounds: Normal breath sounds. No stridor. No wheezing, rhonchi or rales.  Abdominal:     General: Bowel sounds are normal.     Palpations: Abdomen is soft.     Tenderness: There is no abdominal tenderness.  Musculoskeletal:     Cervical back: Normal range of motion.  Skin:    General: Skin is warm and dry.  Neurological:     General: No focal deficit present.     Mental Status: He is alert and oriented to person, place, and time.  Psychiatric:        Mood and Affect: Mood normal.        Behavior: Behavior normal.      UC Treatments / Results  Labs (all labs ordered are listed, but only abnormal results are displayed) Labs Reviewed  POC SOFIA SARS ANTIGEN FIA    EKG   Radiology No results found.  Procedures Procedures  (including critical care time)  Medications Ordered in UC Medications - No data to display  Initial Impression / Assessment and Plan /  UC Course  I have reviewed the triage vital signs and the nursing notes.  Pertinent labs & imaging results that were available during my care of the patient were reviewed by me and considered in my medical decision making (see chart for details).  COVID test is negative.  The patient is well-appearing, he is in no acute distress, vital signs are stable.  On exam, lung sounds are clear throughout.  No indication for imaging.  Patient states that he is feeling better today.  Symptomatic treatment provided with fluticasone  50 mcg nasal spray, and Promethazine  DM for his cough.  Will also prescribe cetirizine  5-120 mg for postnasal drainage.  Azithromycin  250 mg prescribed if symptoms do not improve over the next 5 to 7 days.  Supportive care recommendations were provided discussed with the patient to include fluids, rest, over-the-counter analgesics, and use of a humidifier during sleep.  Discussed indications with patient regarding follow-up.  Patient was in agreement with this plan of care and verbalizes understanding.  All questions were answered.  Patient stable for discharge.  Final Clinical Impressions(s) / UC Diagnoses   Final diagnoses:  Symptoms of upper respiratory infection (URI)   Discharge Instructions   None    ED Prescriptions   None    PDMP not reviewed this encounter.   Gilmer Etta PARAS, NP 11/02/23 1646

## 2023-11-02 NOTE — ED Triage Notes (Signed)
 Pt reports cough, sinus congestion,sinus drainage weakness, soreness x 5 days. Last 2 night pt has had to sit up to sleep due to the drainage.

## 2023-12-29 ENCOUNTER — Other Ambulatory Visit: Payer: Self-pay | Admitting: *Deleted

## 2023-12-29 DIAGNOSIS — K4090 Unilateral inguinal hernia, without obstruction or gangrene, not specified as recurrent: Secondary | ICD-10-CM

## 2024-01-02 ENCOUNTER — Encounter: Payer: Self-pay | Admitting: General Surgery

## 2024-01-02 ENCOUNTER — Ambulatory Visit: Admitting: General Surgery

## 2024-01-02 VITALS — BP 139/79 | HR 58 | Temp 98.0°F | Resp 14 | Ht 68.0 in | Wt 191.0 lb

## 2024-01-02 DIAGNOSIS — K4090 Unilateral inguinal hernia, without obstruction or gangrene, not specified as recurrent: Secondary | ICD-10-CM | POA: Diagnosis not present

## 2024-01-03 NOTE — Progress Notes (Signed)
 Ronald Dixon; [id number]; 01/15/1946   HPI Patient is a 78 year old white male who is referred to my care by Dr. Norleen General for evaluation and treatment of left groin swelling.  Patient states he has had intermittent episodes of left groin swelling that are present when he is standing.  He has minimal discomfort in the left groin region and it is not affecting his daily lifestyle at the present time.  No nausea or vomiting have been noted.  He has never had inguinal hernia surgery in the past. Past Medical History:  Diagnosis Date   Hypercholesteremia    Hypertension     Past Surgical History:  Procedure Laterality Date   COLONOSCOPY     COLONOSCOPY N/A 08/16/2017   Procedure: COLONOSCOPY;  Surgeon: Shaaron Lamar HERO, MD;  Location: [facility];  Service: Endoscopy;  Laterality: N/A;  10:30    Family History  Problem Relation Age of Onset   Colon cancer Brother     Current Outpatient Medications on File Prior to Visit  Medication Sig Dispense Refill   aspirin EC 81 MG tablet Take 81 mg by mouth daily.     Cholecalciferol (VITAMIN D3) 1000 units CAPS Take 1,000 Units by mouth daily.      dorzolamide-timolol (COSOPT) 2-0.5 % ophthalmic solution      finasteride  (PROSCAR ) 5 MG tablet Take 1 tablet (5 mg total) by mouth daily. NEEDS APPT FOR REFILLS 30 tablet 0   Gluc-Chonn-MSM-Boswellia-Vit D (GLUCOSAMINE CHOND TRIPLE/VIT D) TABS Take 1 tablet by mouth daily.      latanoprost (XALATAN) 0.005 % ophthalmic solution      levothyroxine (SYNTHROID) 50 MCG tablet Take 25 mcg by mouth daily.     olmesartan-hydrochlorothiazide (BENICAR HCT) 40-25 MG tablet      simvastatin (ZOCOR) 40 MG tablet Take 40 mg by mouth daily.   0   No current facility-administered medications on file prior to visit.    No Known Allergies  Social History   Substance and Sexual Activity  Alcohol Use Never    Social History   Tobacco Use  Smoking Status Never  Smokeless Tobacco Never    Review of  Systems  Constitutional:  Positive for malaise/fatigue.  HENT: Negative.    Eyes: Negative.   Respiratory: Negative.    Cardiovascular: Negative.   Gastrointestinal:  Positive for heartburn.  Musculoskeletal:  Positive for back pain and joint pain.  Skin: Negative.   Neurological: Negative.   Endo/Heme/Allergies: Negative.   Psychiatric/Behavioral: Negative.      Objective   Vitals:   01/02/24 1406  BP: 139/79  Pulse: (!) 58  Resp: 14  Temp: 98 F (36.7 C)  SpO2: 94%    Physical Exam Vitals reviewed.  Constitutional:      Appearance: Normal appearance. He is normal weight. He is not ill-appearing.  HENT:     Head: Normocephalic and atraumatic.  Cardiovascular:     Rate and Rhythm: Normal rate and regular rhythm.     Heart sounds: Normal heart sounds. No murmur heard.    No friction rub. No gallop.  Pulmonary:     Effort: Pulmonary effort is normal. No respiratory distress.     Breath sounds: Normal breath sounds. No stridor. No wheezing, rhonchi or rales.  Abdominal:     General: Bowel sounds are normal. There is no distension.     Palpations: Abdomen is soft. There is no mass.     Tenderness: There is no abdominal tenderness. There is no guarding  or rebound.     Hernia: No hernia is present.     Comments: There is laxity of the abdominal wall in the left groin region with some discomfort to deep palpation over the internal ring.  I could not definitively appreciate a left inguinal hernia while supine or standing.  No right inguinal hernias present.  Genitourinary:    Testes: Normal.  Skin:    General: Skin is warm and dry.  Neurological:     Mental Status: He is alert and oriented to person, place, and time.    Primary care notes reviewed Assessment  Left groin swelling.  Patient may have an occult left inguinal hernia, but it is not evident on today's examination. Plan  As patient is asymptomatic at the present time, I do not advise any further imaging  studies as they can be variable as to identifying hernias.  I did give patient literature concerning inguinal hernias.  I told him that I do not advise surgical intervention at the present time.  He understands and agrees.  Should he develop worsening swelling in the left groin region, I told him to return to my care for reevaluation.
